# Patient Record
Sex: Male | Born: 2000 | Race: White | Hispanic: No | Marital: Single | Smoking: Never smoker
Health system: Southern US, Community
[De-identification: ages and names within clinical notes are randomized; demographics above are authoritative.]

## PROBLEM LIST (undated history)

## (undated) DIAGNOSIS — F419 Anxiety disorder, unspecified: Secondary | ICD-10-CM

## (undated) DIAGNOSIS — Z789 Other specified health status: Secondary | ICD-10-CM

## (undated) DIAGNOSIS — J302 Other seasonal allergic rhinitis: Secondary | ICD-10-CM

## (undated) HISTORY — PX: TYMPANOPLASTY: SHX33

---
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
## 2019-06-15 ENCOUNTER — Encounter: Payer: Self-pay | Admitting: Emergency Medicine

## 2019-06-15 ENCOUNTER — Ambulatory Visit
Admission: EM | Admit: 2019-06-15 | Discharge: 2019-06-15 | Disposition: A | Discharge status: Home / Self Care | Payer: Medicaid Other | Attending: Urgent Care | Admitting: Urgent Care

## 2019-06-15 ENCOUNTER — Other Ambulatory Visit: Payer: Self-pay

## 2019-06-15 DIAGNOSIS — Z20822 Contact with and (suspected) exposure to covid-19: Secondary | ICD-10-CM

## 2019-06-15 DIAGNOSIS — Z03818 Encounter for observation for suspected exposure to other biological agents ruled out: Secondary | ICD-10-CM | POA: Diagnosis not present

## 2019-06-15 DIAGNOSIS — J069 Acute upper respiratory infection, unspecified: Secondary | ICD-10-CM | POA: Insufficient documentation

## 2019-06-15 DIAGNOSIS — U071 COVID-19: Secondary | ICD-10-CM | POA: Insufficient documentation

## 2019-06-15 DIAGNOSIS — Z20828 Contact with and (suspected) exposure to other viral communicable diseases: Secondary | ICD-10-CM

## 2019-06-15 NOTE — Discharge Instructions (Signed)
Push fluids to ensure adequate hydration and keep secretions thin.  Tylenol and/or ibuprofen as needed for pain or fevers.  Rest.  Over the counter  treatments as needed for symptoms. Self isolate until covid results are back and negative.  Will notify you of any positive findings. You may monitor your results on your MyChart online as well.

## 2019-06-15 NOTE — ED Triage Notes (Signed)
Patient c/o cough, loss of taste and chills that started 2 days ago.

## 2019-06-15 NOTE — ED Provider Notes (Signed)
[facility]    CSN: [id number] Arrival date & time: 06/15/19  1303      History   Chief Complaint Chief Complaint  Patient presents with  . Cough    HPI Peter Brady is a 18 y.o. male.   Peter Brady presents with complaints of symptoms which started 2-3 days ago. While at work he developed a cough. Cough causes sore throat.  Has been waking up sweating. Feels warm. Chills. Decreased appetite. Yesterday with congestion. No chest pain. No fevers. Nausea. Diarrhea, approximately 4 times a day. No blood in diarrhea. No known ill contacts. 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 Loss of taste and smell. Ibuprofen the other day, no other medications for symptoms. Vapes. No asthma.    ROS per HPI, negative if not otherwise mentioned.      Past Medical History:  Diagnosis Date  . Patient denies medical problems     There are no active problems to display for this patient.   Past Surgical History:  Procedure Laterality Date  . TYMPANOPLASTY         Home Medications    Prior to Admission medications   Medication Sig Start Date End Date Taking? Authorizing Provider  oseltamivir (TAMIFLU) 75 MG capsule Take 1 capsule (75 mg total) by mouth every 12 (twelve) hours. 09/14/18   Marylene Land, NP    Family History History reviewed. No pertinent family history.  Social History Social History   Tobacco Use  . Smoking status: Never Smoker  . Smokeless tobacco: Never Used  Substance Use Topics  . Alcohol use: No  . Drug use: Never     Allergies   Patient has no known allergies.   Review of Systems Review of Systems   Physical Exam Triage Vital Signs ED Triage Vitals  Enc Vitals Group     BP 06/15/19 1314 126/85     Pulse Rate 06/15/19 1314 76     Resp 06/15/19 1314 14     Temp 06/15/19 1314 98.9 F (37.2 C)     Temp Source 06/15/19  1314 Oral     SpO2 06/15/19 1314 100 %     Weight 06/15/19 1310 141 lb (64 kg)     Height 06/15/19 1310 5\' 10"  (1.778 m)     Head Circumference --      Peak Flow --      Pain Score 06/15/19 1310 0     Pain Loc --      Pain Edu? --      Excl. in [place]? --    No data found.  Updated Vital Signs BP 126/85 (BP Location: Left Arm)   Pulse 76   Temp 98.9 F (37.2 C) (Oral)   Resp 14   Ht 5\' 10"  (1.778 m)   Wt 141 lb (64 kg)   SpO2 100%   BMI 20.23 kg/m    Physical Exam Constitutional:      Appearance: He is well-developed.  Cardiovascular:     Rate and Rhythm: Normal rate.  Pulmonary:     Effort: Pulmonary effort is normal.  Skin:    General: Skin is warm and dry.  Neurological:     Mental Status: He is alert and oriented to person, place, and time.      UC Treatments / Results  Labs (all labs ordered are listed, but only  abnormal results are displayed) Labs Reviewed  NOVEL CORONAVIRUS, NAA (HOSP ORDER, SEND-OUT TO REF LAB; TAT 18-24 HRS)    EKG   Radiology No results found.  Procedures Procedures (including critical care time)  Medications Ordered in UC Medications - No data to display  Initial Impression / Assessment and Plan / UC Course  I have reviewed the triage vital signs and the nursing notes.  Pertinent labs & imaging results that were available during my care of the patient were reviewed by me and considered in my medical decision making (see chart for details).     Non toxic. Benign physical exam.  Afebrile. covid testing collected and pending. History and physical consistent with viral illness.  Supportive cares recommended. Will notify of any positive findings and if any changes to treatment are needed.  Isolation encouraged until results back. Return precautions provided. Patient verbalized understanding and agreeable to plan.   Final Clinical Impressions(s) / UC Diagnoses   Final diagnoses:  Viral URI with cough  Encounter for laboratory  testing for COVID-19 virus     Discharge Instructions     Push fluids to ensure adequate hydration and keep secretions thin.  Tylenol and/or ibuprofen as needed for pain or fevers.  Rest.  Over the counter  treatments as needed for symptoms. Self isolate until covid results are back and negative.  Will notify you of any positive findings. You may monitor your results on your MyChart online as well.      ED Prescriptions    None     PDMP not reviewed this encounter.   Georgetta Haber, NP 06/15/19 1343

## 2019-06-16 ENCOUNTER — Telehealth (HOSPITAL_COMMUNITY): Payer: Self-pay | Admitting: Emergency Medicine

## 2019-06-16 MED ORDER — BENZONATATE 100 MG PO CAPS: 100.0000 mg | ORAL_CAPSULE | Freq: Three times a day (TID) | ORAL | 0 refills | Status: DC

## 2019-06-16 NOTE — Telephone Encounter (Signed)
Positive covid, Patient contacted and made aware of    results, all questions answered Gave info for quarantine, pt c/o dry cough, per kelly okay to send tessalon.

## 2019-06-19 LAB — NOVEL CORONAVIRUS, NAA (HOSP ORDER, SEND-OUT TO REF LAB; TAT 18-24 HRS): SARS-CoV-2, NAA: DETECTED — AB

## 2020-02-18 ENCOUNTER — Ambulatory Visit (INDEPENDENT_AMBULATORY_CARE_PROVIDER_SITE_OTHER): Payer: Medicaid Other | Admitting: Family Medicine

## 2020-02-18 ENCOUNTER — Other Ambulatory Visit: Payer: Self-pay

## 2020-02-18 ENCOUNTER — Encounter: Payer: Self-pay | Admitting: Family Medicine

## 2020-02-18 VITALS — BP 102/50 | HR 66 | Temp 98.2°F | Ht 69.0 in | Wt 142.8 lb

## 2020-02-18 DIAGNOSIS — F419 Anxiety disorder, unspecified: Secondary | ICD-10-CM | POA: Diagnosis not present

## 2020-02-18 DIAGNOSIS — Z915 Personal history of self-harm: Secondary | ICD-10-CM

## 2020-02-18 DIAGNOSIS — J302 Other seasonal allergic rhinitis: Secondary | ICD-10-CM | POA: Diagnosis not present

## 2020-02-18 DIAGNOSIS — F1210 Cannabis abuse, uncomplicated: Secondary | ICD-10-CM

## 2020-02-18 DIAGNOSIS — F329 Major depressive disorder, single episode, unspecified: Secondary | ICD-10-CM | POA: Diagnosis not present

## 2020-02-18 DIAGNOSIS — Z7689 Persons encountering health services in other specified circumstances: Secondary | ICD-10-CM

## 2020-02-18 DIAGNOSIS — Z818 Family history of other mental and behavioral disorders: Secondary | ICD-10-CM | POA: Insufficient documentation

## 2020-02-18 DIAGNOSIS — Z9151 Personal history of suicidal behavior: Secondary | ICD-10-CM | POA: Insufficient documentation

## 2020-02-18 LAB — POCT URINALYSIS DIPSTICK
Bilirubin, UA: NEGATIVE
Blood, UA: NEGATIVE
Glucose, UA: NEGATIVE
Ketones, UA: NEGATIVE
Leukocytes, UA: NEGATIVE
Nitrite, UA: NEGATIVE
Protein, UA: NEGATIVE
Spec Grav, UA: 1.025
Urobilinogen, UA: 0.2 U/dL
pH, UA: 5

## 2020-02-18 MED ORDER — BUSPIRONE HCL 7.5 MG PO TABS: 7.5000 mg | ORAL_TABLET | Freq: Three times a day (TID) | ORAL | 1 refills | Status: DC

## 2020-02-18 MED ORDER — CETIRIZINE HCL 10 MG PO TABS: 10.0000 mg | ORAL_TABLET | Freq: Every day | ORAL | 3 refills | Status: DC

## 2020-02-18 NOTE — Assessment & Plan Note (Signed)
History 2018 with [facility], hx of attempted overdose with  melatonin

## 2020-02-18 NOTE — Assessment & Plan Note (Signed)
Seasonal allergies, previously treated with claritin.  Reports suboptimbal control.  Requesting to change medications.  Plan: 1. Stop claritin, BEGIN Cetrizine.  Rx sent to pharmacy on file 2. Follow up 4 weeks

## 2020-02-18 NOTE — Assessment & Plan Note (Signed)
See anxiety A/P. 

## 2020-02-18 NOTE — Assessment & Plan Note (Addendum)
Current severe anxiety and depression, interfering with work and sleep. Reportedly was on fluoxetine 20mg  in 2018, had issues with having an erection and abruptly stopped the medications.  Has not tried any additional SSRIs since.  Tearful during exam.  Has history of suicide attempt in 2018 and was inpatient hospitalized for attempt to overdose on melatonin.  Reports recently had found out his girlfriend was cheating on him and same day found out his father was hospitalized for a suicide attempt.  Plan: 1. Begin Buspar 7.5mg  TID PRN for anxiety 2. STAT referral to psychiatry made today for medication management and counseling 3. Follow up in 3 months

## 2020-02-18 NOTE — Progress Notes (Signed)
Subjective:    Patient ID: Peter Brady, male    DOB: May 07, 2001, 19 y.o.   MRN: [id number]  Peter Brady is a 19 y.o. male presenting on 02/18/2020 for Establish Care (anxiety, depression. Pt state his anxiety is starting toi nterfere with his job.)   HPI  Previous PCP was at [facility] on [address].  Records will be requested.  Past medical, family, and surgical history reviewed w/ pt.   Mr. Stetzer presents to clinic with acute concerns of anxiety and depression.  Reports that he has been having increased anxiety and tearfulness throughout the day.  He has been having to take extra breaks at work so he is not crying in front of co-workers.  Reports that he has been anxious if he is doing a good job at work, even with praise from his [employer]chand[employer], [employer].  Finds that he is waking up in the middle of the night with anxiety and racing thoughts.  Past suicide attempt in 2018, had attempted to overdose on melatonin, was brought to [facility] and was admitted.  Has not followed with psychiatry or counseling since.  Denies current SI/HI.  Depression screen PHQ 2/9 02/18/2020  Decreased Interest 3  Down, Depressed, Hopeless 3  PHQ - 2 Score 6  Altered sleeping 3  Tired, decreased energy 0  Change in appetite 3  Trouble concentrating 0  Moving slowly or fidgety/restless 0  Suicidal thoughts 0  PHQ-9 Score 12  Difficult doing work/chores Extremely dIfficult    Social History   Tobacco Use  . Smoking status: Never Smoker  . Smokeless tobacco: Never Used  Vaping Use  . Vaping Use: Every day  . Substances: Nicotine, Flavoring  Substance Use Topics  . Alcohol use: No  . Drug use: Never    Review of Systems  Constitutional: Negative.   HENT: Positive for rhinorrhea. Negative for congestion, dental problem, drooling, ear discharge, ear pain, facial swelling, hearing loss, mouth sores, nosebleeds, postnasal drip, sinus pressure, sinus pain, sneezing, sore throat, tinnitus, trouble swallowing  and voice change.   Eyes: Negative.   Respiratory: Negative.   Cardiovascular: Negative.   Gastrointestinal: Negative.   Endocrine: Negative.   Genitourinary: Negative.   Musculoskeletal: Negative.   Skin: Negative.   Allergic/Immunologic: Negative.   Neurological: Negative.   Hematological: Negative.   Psychiatric/Behavioral: Positive for dysphoric mood and sleep disturbance. Negative for agitation, behavioral problems, confusion, decreased concentration, hallucinations, self-injury and suicidal ideas. The patient is nervous/anxious. The patient is not hyperactive.    Per HPI unless specifically indicated above     Objective:    BP (!) 102/50 (BP Location: Left Arm, Patient Position: Sitting, Cuff Size: Normal)   Pulse 66   Temp 98.2 F (36.8 C) (Temporal)   Ht 5\' 9"  (1.753 m)   Wt 142 lb 12.8 oz (64.8 kg)   BMI 21.09 kg/m   Wt Readings from Last 3 Encounters:  02/18/20 142 lb 12.8 oz (64.8 kg) (29 %, Z= -0.54)*  06/15/19 141 lb (64 kg) (30 %, Z= -0.53)*  09/14/18 170 lb (77.1 kg) (77 %, Z= 0.73)*   * Growth percentiles are based on CDC (Boys, 2-20 Years) data.    Physical Exam Vitals reviewed.  Constitutional:      General: He is not in acute distress.    Appearance: Normal appearance. He is well-developed, well-groomed and normal weight. He is not ill-appearing or toxic-appearing.  HENT:     Head: Normocephalic and atraumatic.  Nose:     Comments: Lesia Sago is in place, covering mouth and nose  Eyes:     General:        Right eye: No discharge.        Left eye: No discharge.     Extraocular Movements: Extraocular movements intact.     Conjunctiva/sclera: Conjunctivae normal.     Pupils: Pupils are equal, round, and reactive to light.  Cardiovascular:     Rate and Rhythm: Normal rate and regular rhythm.     Pulses: Normal pulses.     Heart sounds: Normal heart sounds. No murmur heard.  No friction rub. No gallop.   Pulmonary:     Effort: Pulmonary effort  is normal. No respiratory distress.     Breath sounds: Normal breath sounds.  Skin:    General: Skin is warm and dry.     Capillary Refill: Capillary refill takes less than 2 seconds.  Neurological:     General: No focal deficit present.     Mental Status: He is alert and oriented to person, place, and time.     Cranial Nerves: No cranial nerve deficit.     Sensory: No sensory deficit.     Motor: No weakness.     Coordination: Coordination normal.     Gait: Gait normal.  Psychiatric:        Attention and Perception: Attention and perception normal.        Mood and Affect: Mood is anxious and depressed. Affect is tearful.        Speech: Speech normal.        Behavior: Behavior normal. Behavior is cooperative.        Thought Content: Thought content normal.        Cognition and Memory: Cognition and memory normal.        Judgment: Judgment normal.    Results for orders placed or performed in visit on 02/18/20  POCT Urinalysis Dipstick  Result Value Ref Range   Color, UA Yellow    Clarity, UA clear    Glucose, UA Negative Negative   Bilirubin, UA negative    Ketones, UA negative    Spec Grav, UA 1.025 1.010 - 1.025   Blood, UA negative    pH, UA 5.0 5.0 - 8.0   Protein, UA Negative Negative   Urobilinogen, UA 0.2 0.2 or 1.0 E.U./dL   Nitrite, UA negative    Leukocytes, UA Negative Negative   Appearance     Odor        Assessment & Plan:   Problem List Items Addressed This Visit      Respiratory   Allergic rhinitis    Seasonal allergies, previously treated with claritin.  Reports suboptimbal control.  Requesting to change medications.  Plan: 1. Stop claritin, BEGIN Cetrizine.  Rx sent to pharmacy on file 2. Follow up 4 weeks      Relevant Medications   cetirizine (ZYRTEC) 10 MG tablet     Other   Anxiety    Current severe anxiety and depression, interfering with work and sleep. Reportedly was on fluoxetine 20mg  in 2018, had issues with having an erection and  abruptly stopped the medications.  Has not tried any additional SSRIs since.  Tearful during exam.  Has history of suicide attempt in 2018 and was inpatient hospitalized for attempt to overdose on melatonin.  Reports recently had found out his girlfriend was cheating on him and same day found out his father was hospitalized for  a suicide attempt.  Plan: 1. Begin Buspar 7.5mg  TID PRN for anxiety 2. STAT referral to psychiatry made today for medication management and counseling 3. Follow up in 3 months      Relevant Medications   busPIRone (BUSPAR) 7.5 MG tablet   Cannabis use disorder, mild, abuse    See anxiety A/P      Major depressive episode    See anxiety A/P      Relevant Medications   busPIRone (BUSPAR) 7.5 MG tablet   History of suicide attempt    History 2018 with [facility], hx of attempted overdose with  melatonin      Family history of suicide attempt    Other Visit Diagnoses    Encounter to establish care    -  Primary   Relevant Medications   busPIRone (BUSPAR) 7.5 MG tablet   Other Relevant Orders   POCT Urinalysis Dipstick (Completed)      Meds ordered this encounter  Medications  . busPIRone (BUSPAR) 7.5 MG tablet    Sig: Take 1 tablet (7.5 mg total) by mouth 3 (three) times daily.    Dispense:  90 tablet    Refill:  1  . cetirizine (ZYRTEC) 10 MG tablet    Sig: Take 1 tablet (10 mg total) by mouth daily.    Dispense:  90 tablet    Refill:  3      Follow up plan: Return in about 4 weeks (around 03/17/2020) for Anxiety follow up.   Harlin Rain, [facility] Nurse Practitioner [facility] 02/18/2020, 11:19 AM

## 2020-02-18 NOTE — Patient Instructions (Signed)
I have sent in a prescription for Buspar 7.38m to take up to 3x per day as needed for anxiety.  A referral to psychiatry has been placed today.  If you have not heard from the specialty office or our referral coordinator within 1 week, please let u[place]now and we will follow up with the referral coordinator for an update.  The following recommendations are helpful adjuncts for helping rebalance your mood.  Eat a nourishing diet. Ensure adequate intake of calories, protein, carbs, fat, vitamins, and minerals. Prioritize whole foods at each meal, including meats, vegetables, fruits, nuts and seeds, etc.   Avoid inflammatory and/or "junk" foods, such as sugar, omega-6 fats, refined grains, chemicals, and preservatives are common in packaged and prepared foods. Minimize or completely avoid these ingredients and stick to whole foods with little to no additives. Cook from scratch as much as possible for more control over what you eat  Get enough sleep. Poor sleep is significantly associated with depression and anxiety. Make 7-9 hours of sleep nightly a top priority  Exercise appropriately. Exercise is known to improve brain functioning and boost mood. Aim for 30 minutes of daily physical activity. Avoid "overtraining," which can cause mental disturbances  Assess your light exposure. Not enough natural light during the day and too much artificial light can have a major impact on your mood. Get outside as often as possible during daylight hours. Minimize light exposure after dark and avoid the use of electronics that give off blue light before bed  Manage your stress.  Use daily stress management techniques such as meditation, yoga, or mindfulness to retrain your brain to respond differently to stress. Try deep breathing to deactivate your "fight or flight" response.  There are many of sources with apps like Headspace, Calm or a variety of YouTube videos (videos from JGwynne Edingerhave guided  meditation)  Prioritize your social life. Work on building social support with new friends or improve current relationships. Consider getting a pet that allows for companionship, social interaction, and physical touch. Try volunteering or joining a faith-based community to increase your sense of purpose  4-7-8 breathing technique at bedtime: breathe in to count of 4, hold breath for count of 7, exhale for count of 8; do 3-5 times for letting go of overactive thoughts  Take time to play Unstructured "play" time can help reduce anxiety and depression Options for play include music, games, sports, dance, art, etc.  Try to add daily omega 3 fatty acids, magnesium, B complex, and balanced amino acid supplements to help improve mood and anxiety.  Sleep hygiene is the single most effective treatment for sleep issues, but it is hard work.  Tips for a good night's sleep:  -Keep sleep environment comfortable and conducive to sleep -Keep regular sleep schedule 7 nights a week -Avoiding naps during the day -Avoiding going to bed until drowsy and ready to sleep, not trying to sleep, and not watching the clock -Get out of bed if not asleep within 15-20 minutes and returning only when drowsy -Avoiding caffeine, nicotine, alcohol, and other substances that interfere with sleep before bedtime -Take an hour before your set bedtime and start to wind down: bath/shower, no more TV or phone (the blue light can interfere with sleeping), listen to soothing music, or meditation -No TV in your bedroom -Exercising regularly, at least 6 hours before sleep. Yoga and Tai Chi can improve sleep quality  There are a lot of books and apps that may help guide  you with any of the following:   -Progressive muscle relaxation (involves methodical tension and relaxation of different Muscle groups throughout body)  Guided imagery  -YouTube - Gwynne Edinger has free videos on YouTube that can help with meditation and some    Abdominal breathing   Over the counter sleep aid one hour before bed- and gradually wean your use over 2-4 weeks  Some examples are : *Melatonin 5-10 mg *Sleepology (Can find on [employer]) taken according to packaging directions  There are a few online evidence based online programs, unfortunately they are not free.   Developed by a sleep expert who created a drug-free program for insomnia proven more effective than sleeping pills.  www.cbtforinsomnia.com Sleepio is an evidence-based digital sleep improvement program   www.sleepio.com SHUTi is designed to actively help retrain your body and mind for great sleep through six engaging Cognitive Behavioral Therapy for Insomnia strategy and learning sessions  [email or web address]  We will plan to see you back in 4 weeks for anxiety follow up  You will receive a survey after today's visit either digitally by e-mail or paper by [employer]. Your experiences and feedback matter to [place].  Please respond so we know how we are doing as we provide care for you.  Call us with any questions/concerns/needs.  It is my goal to be available to you for your health concerns.  Thanks for choosing me to be a partner in your healthcare needs!  Harlin Rain, FNP-C Family Nurse Practitioner [facility] Phone: [phone]

## 2020-02-19 ENCOUNTER — Telehealth: Payer: Self-pay

## 2020-02-19 NOTE — Telephone Encounter (Signed)
-----   Message from Tarri Fuller, FNP sent at 02/19/2020  3:53 PM EDT ----- Regarding: Referral Placed Hey team,      I put in an urgent behavioral health referral for this patient.  He needs a higher level of care than what [facility] can accommodate.  I was given these resources of [facility] or [facility] (both walk in and he would need to arrive by 8am) or [facility] [phone] and he can call and schedule an appointment.  I tried to contact him but the phone went to voicemail and the mailbox is not set up.  I will continue to try him but wanted you to have the information I was trying to provide in case he called back.

## 2020-02-20 NOTE — Telephone Encounter (Signed)
Telephone call to patient.  Provided [facility] and [facility] contact phone numbers to call and schedule for medication management.  Patient verbalized understanding and denied any acute concerns for FNP at this time.

## 2020-02-26 ENCOUNTER — Ambulatory Visit: Payer: Medicaid Other | Admitting: Licensed Clinical Social Worker

## 2020-02-27 NOTE — Chronic Care Management (AMB) (Signed)
Visit Information  Goals Addressed    .  SW- "I want to work on my mental health" (pt-stated)        [place] (see longitudinal plan of care for additional care plan information)  Current Barriers:  . Financial constraints related to affording care . Limited social support . Transportation . Mental Health Concerns  . Social Isolation . Lacks knowledge of community resource: available mental health providers that accept Medicaid.   Clinical Social Work Clinical Goal(s):   Marland Kitchen Over the next 120 days, patient will work with SW to address concerns related to gaining additional mental health support/resource connection in order to maintain overall health . Over the next 120 days, patient will demonstrate improved adherence to self care as evidenced by implementing healthy self-care into his daily routine such as: attending all medical appointments, deep breathing exercsies, taking time for self-reflection, taking medications as prescribed, drinking water and daily exercise to improve mobility.  . Over the next 120 days, patient will demonstrate improved health management independence as evidenced by implementing healthy self-care and positive support and mental health resources into his daily routine to cope with stressors and improve overall health and well-being   Interventions: . Inter-disciplinary care team collaboration (see longitudinal plan of care) . Patient interviewed and appropriate assessments performed . Provided mental health counseling with regard to coping with major depressive disorder . Provided patient with information about available mental health resources and support groups. LCSW sent text message to patient with the following resources: . [facility]) & [facility] . Mental Health Clinic . Address: [address] [place], [place] [address] . Phone:  [phone] . [facility] . [address] [place], [place]  [address] . Main: [phone] . [facility]: [facility] . Mental health clinic in [place], [place] . Located in: [facility] . Address: [address] [place], [place], [place] [address] . Phone: [phone] . [facility] Address: [address], [place], [place] [address] . Phone: [phone] . [facility] . Medical clinic in [place], [place] . Address: [place], [place], [place] [address] . Phone: [phone] . [facility] . Mental health service in [place], [place] . Address: [place], [place], [place] [address] . Phone: [phone] . [facility] . Psychiatrist in [place], [place] . Address: [address] [place], [place] [address] . Appointments: [email or web address] . Phone: [phone] . [facility] . [address] [place], [place] [address] . [phone] . [facility] . [place], [place] [address] . [phone] . [employer] . [place] [address] [place], [place] [address] . [phone] . [facility]  . Address: [address] [place], [place] [address] . Phone: [phone] . [facility] . [place], [place] [address] . [phone] . [facility] counseling . Family counselor in [place], [place] . Address: [address] [place],  [address] . Phone: [phone] . [facility] [place] Address: [address], [place],  [address] . Phone: [phone] . [facility] . [phone] . General Email: [facility]@therapist .net . HIPAA Compliant Secure  Email . Info@[facility] .com . [place],  [place] [address] . Civil engineer, contracting  [employer] . Hospice in [place], [facility] . Address: [address] [place], [place], [place] [address] . Phone: [phone] . FREE Individual Counseling and Support Groups  . For group support I would call [facility]- [phone]. They have groups for - Anxiety disorders, mood disorders (depression, bipolar), schizophrenia, dual diagnoses (mental illness/substance abuse), eating disorders (anorexia, bulimia) at the [facility].  [facility] ACT Team [phone]- Program has Behavioral Health Outpatient follow up. . [facility] Community Treatment Team in [place] . Our Assertive Community Treatment Team is an evidence-based service designed for adults with serious mental illness that provides a full range of treatment, rehabilitation and support services by an interdisciplinary team in a variety of community based settings. Services include psychiatric and nursing services, assistance with housing and employment, substance abuse treatment, peer support and case management. Patient must have Medicaid or State funding for this service.  . Discussed plans with patient for ongoing care management follow up and provided patient with direct contact information for care management team . Advised patient to contact [facility], [facility] and [facility] for enrollment. . Assisted patient/caregiver with obtaining information about health plan benefits . LCSW discussed coping skills for anxiety and depression. SW used empathetic and active and reflective listening, validated patient's feelings/concerns, and provided emotional support. LCSW provided self-care education to help manage his conditions and improve his overall mood.  Marland Kitchen Referred patient to [facility] for long term follow up and  therapy/counseling  Patient Self Care Activities:  . Calls provider office for new concerns or questions . Lacks social connections  Initial goal documentation     Peter Brady was given information about Care Management services today including:  1. Care Management services include personalized support from designated clinical staff supervised by his physician, including individualized plan of care and coordination with other care providers 2. 24/7 contact phone numbers for assistance for urgent and routine care needs. 3. The patient may stop CCM services at any time (effective at the end of the month) by phone call to the office staff.  Patient agreed to services and verbal consent obtained.   The care management team will reach out to the patient again over the next quarter.  Dickie La, BSW, MSW, LCSW [facility] [place]  [facility] [email or web address] Phone: [phone]

## 2020-03-14 ENCOUNTER — Ambulatory Visit: Payer: Medicaid Other | Admitting: Family Medicine

## 2020-03-17 ENCOUNTER — Encounter: Payer: Self-pay | Admitting: Emergency Medicine

## 2020-03-17 ENCOUNTER — Other Ambulatory Visit: Payer: Self-pay

## 2020-03-17 ENCOUNTER — Ambulatory Visit
Admission: EM | Admit: 2020-03-17 | Discharge: 2020-03-17 | Disposition: A | Discharge status: Other Acute Inpatient Hospital | Payer: Medicaid Other | Attending: Family Medicine | Admitting: Family Medicine

## 2020-03-17 DIAGNOSIS — N50812 Left testicular pain: Secondary | ICD-10-CM

## 2020-03-17 DIAGNOSIS — S3994XA Unspecified injury of external genitals, initial encounter: Secondary | ICD-10-CM | POA: Diagnosis not present

## 2020-03-17 HISTORY — DX: Other seasonal allergic rhinitis: J30.2

## 2020-03-17 HISTORY — DX: Anxiety disorder, unspecified: F41.9
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
## 2020-03-17 NOTE — Discharge Instructions (Addendum)
Recommend to go to Emergency Department for further evaluation/management
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
## 2020-03-18 ENCOUNTER — Other Ambulatory Visit: Payer: Self-pay

## 2020-03-18 ENCOUNTER — Ambulatory Visit (INDEPENDENT_AMBULATORY_CARE_PROVIDER_SITE_OTHER): Payer: Medicaid Other | Admitting: Family Medicine

## 2020-03-18 ENCOUNTER — Ambulatory Visit
Admission: RE | Admit: 2020-03-18 | Discharge: 2020-03-18 | Disposition: A | Discharge status: Home / Self Care | Payer: Medicaid Other | Source: Ambulatory Visit | Attending: Family Medicine | Admitting: Family Medicine

## 2020-03-18 ENCOUNTER — Encounter: Payer: Self-pay | Admitting: Family Medicine

## 2020-03-18 VITALS — BP 115/63 | HR 70 | Temp 97.7°F | Ht 69.0 in | Wt 135.0 lb

## 2020-03-18 DIAGNOSIS — F419 Anxiety disorder, unspecified: Secondary | ICD-10-CM

## 2020-03-18 DIAGNOSIS — N50812 Left testicular pain: Secondary | ICD-10-CM | POA: Diagnosis present

## 2020-03-18 DIAGNOSIS — F5104 Psychophysiologic insomnia: Secondary | ICD-10-CM | POA: Diagnosis not present

## 2020-03-18 MED ORDER — HYDROXYZINE PAMOATE 25 MG PO CAPS: 25.0000 mg | ORAL_CAPSULE | Freq: Every evening | ORAL | 0 refills | Status: DC | PRN

## 2020-03-18 MED ORDER — BUSPIRONE HCL 15 MG PO TABS: 15.0000 mg | ORAL_TABLET | Freq: Two times a day (BID) | ORAL | 1 refills | Status: DC

## 2020-03-18 NOTE — Patient Instructions (Signed)
I have switched your buspar from 7.5mg  3x per day to 15mg  twice per day.  This will make for easier dosing, especially when you are working.  I have sent in a prescription for vistaril to take 1 tablet at bedtime to help with anxiety at night.  We have put in an order for a STAT scrotum ultrasound.  Please complete this ultrasound and wait in the office for to be notified so we can let you know the results.  We will plan to see you back in 4 weeks for anxiety follow up visit  You will receive a survey after today's visit either digitally by e-mail or paper by [employer] mail. Your experiences and feedback matter to [place].  Please respond so we know how we are doing as we provide care for you.  Call us with any questions/concerns/needs.  It is my goal to be available to you for your health concerns.  Thanks for choosing me to be a partner in your healthcare needs!  [place], FNP-C Family Nurse Practitioner [facility] Phone: [phone]
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
## 2020-03-18 NOTE — Progress Notes (Signed)
Subjective:    Patient ID: Peter Brady, male    DOB: 09/10/2000, 19 y.o.   MRN: [id number]  Peter Brady is a 19 y.o. male presenting on 03/18/2020 for Anxiety and Testicle Pain ( c/o left testicle pain  x 2 days . States he got hit in the testicles with a box . He was seen at the Urgent Care on Monday and was told he possibly need an Ultrasound )   HPI  Peter Brady presents to clinic for follow up on his anxiety since starting on buspar.  Reports that he has been having good symptom improvement but would prefer to change dosing to twice per day instead of three times per day.  Is having some difficulty sleeping and is unsure if this is his anxiety or the buspar that is doing this.  Looking for something to help with sleep difficulties.  Patient also has concerns for left testicle pain x 2 days.  Reports he had been helping a friend with boxes, had a box hit him in the left testicle and did not have pain immediately, but as the time progressed he has had worsening pain in the groin and left testicle, at points, bringing him to tears.  He went to work yesterday and had to leave after a short while due to his pain.  Reports when he sits, he has symptom relief, but when he is on his feet for more than 15 minutes or is moving around his pain increases.  Reports went to urgent care yesterday and was advised to proceed to the emergency room for evaluation.   Depression screen [facility] 2/9 03/18/2020 02/18/2020  Decreased Interest 1 3  Down, Depressed, Hopeless 1 3  PHQ - 2 Score 2 6  Altered sleeping 3 3  Tired, decreased energy 0 0  Change in appetite 3 3  Feeling bad or failure about yourself  1 -  Trouble concentrating 1 0  Moving slowly or fidgety/restless 0 0  Suicidal thoughts 1 0  PHQ-9 Score 11 12  Difficult doing work/chores Very difficult Extremely dIfficult    Social History   Tobacco Use  . Smoking status: Never Smoker  . Smokeless tobacco: Never Used  Vaping Use  . Vaping Use: Every  day  . Substances: Nicotine, THC, CBD, Flavoring  Substance Use Topics  . Alcohol use: Yes    Comment: social  . Drug use: Never    Review of Systems  Constitutional: Negative.   HENT: Negative.   Eyes: Negative.   Respiratory: Negative.   Cardiovascular: Negative.   Gastrointestinal: Negative.   Endocrine: Negative.   Genitourinary: Positive for scrotal swelling and testicular pain. Negative for decreased urine volume, difficulty urinating, discharge, dysuria, enuresis, flank pain, frequency, genital sores, hematuria, penile pain, penile swelling and urgency.  Musculoskeletal: Negative.   Skin: Negative.   Allergic/Immunologic: Negative.   Neurological: Negative.   Hematological: Negative.   Psychiatric/Behavioral: Positive for sleep disturbance. Negative for agitation, behavioral problems, confusion, decreased concentration, dysphoric mood, hallucinations, self-injury and suicidal ideas. The patient is nervous/anxious. The patient is not hyperactive.    Per HPI unless specifically indicated above     Objective:    BP 115/63 (BP Location: Left Arm, Patient Position: Sitting, Cuff Size: Normal)   Pulse 70   Temp 97.7 F (36.5 C) (Temporal)   Ht 5\' 9"  (1.753 m)   Wt 135 lb (61.2 kg)   BMI 19.94 kg/m   Wt Readings from Last 3 Encounters:  03/18/20 135 lb (61.2 kg) (17 %, Z= -0.94)*  03/17/20 140 lb (63.5 kg) (25 %, Z= -0.68)*  02/18/20 142 lb 12.8 oz (64.8 kg) (29 %, Z= -0.54)*   * Growth percentiles are based on CDC (Boys, 2-20 Years) data.    Physical Exam Vitals reviewed. Exam conducted with a chaperone present Tedd Sias, CMA).  Constitutional:      General: He is not in acute distress.    Appearance: Normal appearance. He is well-developed, well-groomed and normal weight. He is not ill-appearing or toxic-appearing.  HENT:     Head: Normocephalic and atraumatic.     Nose:     Comments: Lesia Sago is in place, covering mouth and nose. Eyes:     General:        Right  eye: No discharge.        Left eye: No discharge.     Extraocular Movements: Extraocular movements intact.     Conjunctiva/sclera: Conjunctivae normal.     Pupils: Pupils are equal, round, and reactive to light.  Cardiovascular:     Rate and Rhythm: Normal rate and regular rhythm.     Pulses: Normal pulses.     Heart sounds: Normal heart sounds. No murmur heard.  No friction rub. No gallop.   Pulmonary:     Effort: Pulmonary effort is normal. No respiratory distress.     Breath sounds: Normal breath sounds.  Abdominal:     Hernia: There is no hernia in the left inguinal area or right inguinal area.  Genitourinary:    Pubic Area: No rash or pubic lice.      Penis: Normal and circumcised. No discharge, swelling or lesions.      Testes:        Right: Mass, tenderness, swelling, testicular hydrocele or varicocele not present. Right testis is descended. Cremasteric reflex is present.         Left: Tenderness and swelling present. Mass, testicular hydrocele or varicocele not present. Left testis is descended. Cremasteric reflex is present.   Musculoskeletal:     Right lower leg: No edema.     Left lower leg: No edema.  Lymphadenopathy:     Lower Body: No right inguinal adenopathy. Left inguinal adenopathy present.     Comments: Shotty, mobile, non-tender lymph node x1 in left inguinal  Skin:    General: Skin is warm and dry.     Capillary Refill: Capillary refill takes less than 2 seconds.  Neurological:     General: No focal deficit present.     Mental Status: He is alert and oriented to person, place, and time.  Psychiatric:        Attention and Perception: Attention and perception normal.        Mood and Affect: Affect normal. Mood is anxious.        Speech: Speech normal.        Behavior: Behavior normal. Behavior is cooperative.        Thought Content: Thought content normal.        Cognition and Memory: Cognition and memory normal.    Results for orders placed or performed  in visit on 02/18/20  POCT Urinalysis Dipstick  Result Value Ref Range   Color, UA Yellow    Clarity, UA clear    Glucose, UA Negative Negative   Bilirubin, UA negative    Ketones, UA negative    Spec Grav, UA 1.025 1.010 - 1.025   Blood, UA negative    pH, UA  5.0 5.0 - 8.0   Protein, UA Negative Negative   Urobilinogen, UA 0.2 0.2 or 1.0 E.U./dL   Nitrite, UA negative    Leukocytes, UA Negative Negative   Appearance     Odor        Assessment & Plan:   Problem List Items Addressed This Visit      Other   Anxiety    Moderate anxiety and depression, reports is lessened with buspar 7.5mg  TID, requesting to switch to BID for better dosing for him.  Has referral to psychiatry but has not had time to schedule an appointment with his busy work schedule.  Has anxiety at night, some difficulty with sleep.  Plan: 1. Change buspar 7.5mg  TID PRN to buspar 15mg  BID PRN 2. Can take vistaril 25mg , 1 tablet at bedtime for insomnia 3. Contact local psychiatry resources and schedule new patient appointment 4. Follow up in 4 weeks      Relevant Medications   busPIRone (BUSPAR) 15 MG tablet   hydrOXYzine (VISTARIL) 25 MG capsule   Pain in left testicle - Primary    Pain in left testicle x 2 days s/p trauma to left side of scrotum with a moving box.  Increased pain with standing/moving around, some relief of symptoms when sitting down.  Pain has brought him to tears yesterday and required him to leave work and proceed to Urgent Care.  Was advised at Urgent Care yesterday to proceed to the emergency room for evaluation.  Plan: 1. STAT of Scrotum to be completed today at [facility] 2. Will contact patient once ultrasound has resulted      Relevant Orders   US SCROTUM W/DOPPLER    Other Visit Diagnoses    Psychophysiological insomnia       Relevant Medications   hydrOXYzine (VISTARIL) 25 MG capsule      Meds ordered this encounter  Medications  .  busPIRone (BUSPAR) 15 MG tablet    Sig: Take 1 tablet (15 mg total) by mouth 2 (two) times daily.    Dispense:  60 tablet    Refill:  1  . hydrOXYzine (VISTARIL) 25 MG capsule    Sig: Take 1 capsule (25 mg total) by mouth at bedtime as needed.    Dispense:  30 capsule    Refill:  0      Follow up plan: Return in about 4 weeks (around 04/15/2020) for Anxiety follow up visit.   [place], FNP Family Nurse Practitioner [facility] 03/18/2020, 4:20 PM

## 2020-03-18 NOTE — Assessment & Plan Note (Addendum)
Moderate anxiety and depression, reports is lessened with buspar 7.5mg  TID, requesting to switch to BID for better dosing for him.  Has referral to psychiatry but has not had time to schedule an appointment with his busy work schedule.  Has anxiety at night, some difficulty with sleep.  Plan: 1. Change buspar 7.5mg  TID PRN to buspar 15mg  BID PRN 2. Can take vistaril 25mg , 1 tablet at bedtime for insomnia 3. Contact local psychiatry resources and schedule new patient appointment 4. Follow up in 4 weeks

## 2020-03-19 ENCOUNTER — Telehealth: Payer: Self-pay | Admitting: *Deleted

## 2020-03-19 NOTE — Telephone Encounter (Signed)
Reviewed testicular ultasound results and provider's note with the patient. No further questions.

## 2020-03-20 ENCOUNTER — Ambulatory Visit: Payer: Medicaid Other | Admitting: Family Medicine

## 2020-03-30 ENCOUNTER — Encounter: Payer: Self-pay | Admitting: Emergency Medicine

## 2020-03-30 ENCOUNTER — Ambulatory Visit
Admission: EM | Admit: 2020-03-30 | Discharge: 2020-03-30 | Disposition: A | Discharge status: Home / Self Care | Payer: Medicaid Other

## 2020-03-30 ENCOUNTER — Other Ambulatory Visit: Payer: Self-pay

## 2020-03-30 DIAGNOSIS — H6981 Other specified disorders of Eustachian tube, right ear: Secondary | ICD-10-CM

## 2020-03-30 NOTE — ED Provider Notes (Signed)
[facility]    CSN: [id number] Arrival date & time: 03/30/20  1159      History   Chief Complaint Chief Complaint  Patient presents with  . Otalgia    HPI Peter Brady is a 19 y.o. male. who presents with R ear pain which started yesterday and at night time when he laid down heard a high pitch ringing which has resolved. His allergies have been acting up and his Zyetec does not seem to be helping. Has used Flonase in the past, but has not been using it lately and recalls this had helped. Denies fever. The ringing is gone and does not have pain any more, but he just wants it checked since he used to be prone to getting ear infections.     Past Medical History:  Diagnosis Date  . Anxiety   . Patient denies medical problems   . Seasonal allergies     Patient Active Problem List   Diagnosis Date Noted  . Pain in left testicle 03/18/2020  . History of suicide attempt 02/18/2020  . Family history of suicide attempt 02/18/2020  . Vitamin D deficiency 12/20/2016  . Allergic rhinitis 12/20/2016  . Anxiety 12/13/2016  . Cannabis use disorder, mild, abuse 12/13/2016  . Major depressive episode 12/13/2016    Past Surgical History:  Procedure Laterality Date  . TYMPANOPLASTY         Home Medications    Prior to Admission medications   Medication Sig Start Date End Date Taking? Authorizing Provider  busPIRone (BUSPAR) 15 MG tablet Take 1 tablet (15 mg total) by mouth 2 (two) times daily. 03/18/20  Yes Malfi, Jodelle Gross, FNP  cetirizine (ZYRTEC) 10 MG tablet Take 1 tablet (10 mg total) by mouth daily. 02/18/20  Yes Malfi, Jodelle Gross, FNP  hydrOXYzine (VISTARIL) 25 MG capsule Take 1 capsule (25 mg total) by mouth at bedtime as needed. 03/18/20  Yes Malfi, Jodelle Gross, FNP    Family History Family History  Problem Relation Age of Onset  . Anxiety disorder Mother   . Suicidality Father   . Depression Father     Social History Social History   Tobacco Use  . Smoking  status: Never Smoker  . Smokeless tobacco: Never Used  Vaping Use  . Vaping Use: Every day  . Substances: Nicotine, THC, CBD, Flavoring  Substance Use Topics  . Alcohol use: Yes    Comment: social  . Drug use: Never     Allergies   Cephalosporins and Penicillins   Review of Systems Review of Systems  Constitutional: Negative for activity change, appetite change, chills, diaphoresis and fever.  HENT: Positive for ear pain, postnasal drip, rhinorrhea, sneezing and tinnitus. Negative for congestion, ear discharge, hearing loss, sore throat and trouble swallowing.   Eyes: Negative for discharge.  Respiratory: Negative for cough.   Gastrointestinal: Negative for nausea.  Musculoskeletal: Negative for myalgias.  Skin: Negative for rash.  Neurological: Negative for dizziness and headaches.     Physical Exam Triage Vital Signs ED Triage Vitals  Enc Vitals Group     BP 03/30/20 1236 (!) 122/93     Pulse Rate 03/30/20 1236 64     Resp 03/30/20 1236 14     Temp 03/30/20 1236 98.3 F (36.8 C)     Temp Source 03/30/20 1236 Oral     SpO2 03/30/20 1236 100 %     Weight 03/30/20 1234 135 lb (61.2 kg)     Height 03/30/20 1234  5\' 9"  (1.753 m)     Head Circumference --      Peak Flow --      Pain Score 03/30/20 1234 0     Pain Loc --      Pain Edu? --      Excl. in GC? --    No data found.  Updated Vital Signs BP (!) 122/93 (BP Location: Left Arm)   Pulse 64   Temp 98.3 F (36.8 C) (Oral)   Resp 14   Ht 5\' 9"  (1.753 m)   Wt 135 lb (61.2 kg)   SpO2 100%   BMI 19.94 kg/m   Visual Acuity Right Eye Distance:   Left Eye Distance:   Bilateral Distance:    Right Eye Near:   Left Eye Near:    Bilateral Near:     Physical Exam Vitals and nursing note reviewed.  Constitutional:      General: He is not in acute distress.    Appearance: He is normal weight. He is not toxic-appearing.  HENT:     Head: Atraumatic.     Right Ear: Ear canal and external ear normal.      Left Ear: Ear canal and external ear normal.     Ears:     Comments: R TM is a little dull, but gray, L TM is gray and has scarring    Nose: Nose normal.     Mouth/Throat:     Mouth: Mucous membranes are moist.     Pharynx: Oropharynx is clear.  Eyes:     General: No scleral icterus.    Conjunctiva/sclera: Conjunctivae normal.  Cardiovascular:     Rate and Rhythm: Normal rate and regular rhythm.  Pulmonary:     Effort: Pulmonary effort is normal.     Breath sounds: Normal breath sounds.  Musculoskeletal:        General: Normal range of motion.     Cervical back: Neck supple.  Skin:    General: Skin is warm and dry.     Findings: No rash.  Neurological:     Mental Status: He is alert and oriented to person, place, and time.     Gait: Gait normal.  Psychiatric:        Mood and Affect: Mood normal.        Behavior: Behavior normal.        Thought Content: Thought content normal.        Judgment: Judgment normal.      UC Treatments / Results  Labs (all labs ordered are listed, but only abnormal results are displayed) Labs Reviewed - No data to display  EKG   Radiology No results found.  Procedures Procedures (including critical care time)  Medications Ordered in UC Medications - No data to display  Initial Impression / Assessment and Plan / UC Course  I have reviewed the triage vital signs and the nursing notes. Advised to get back on Flonase as noted in instructions.  Final Clinical Impressions(s) / UC Diagnoses   Final diagnoses:  Eustachian tube dysfunction, right     Discharge Instructions     Use the Flonase( fluticasone) 2 sprays each nostril per day for 7 days as needed for allergies and stuffy ears.    ED Prescriptions    None     PDMP not reviewed this encounter.   04/01/20, PA-C 03/30/20 1311

## 2020-03-30 NOTE — Discharge Instructions (Signed)
Use the Flonase( fluticasone) 2 sprays each nostril per day for 7 days as needed for allergies and stuffy ears.

## 2020-03-30 NOTE — ED Triage Notes (Signed)
Patient c/o right ear pain that started yesterday.  Patient also ringing in his right ear.  Patient denies fevers.

## 2020-04-01 ENCOUNTER — Ambulatory Visit: Payer: Medicaid Other | Admitting: Licensed Clinical Social Worker

## 2020-04-01 NOTE — Chronic Care Management (AMB) (Signed)
Care Management   Follow Up Note   04/01/2020 Name: Peter Brady MRN: [id number] DOB: 05/10/2001  Referred by: Tarri Fuller, FNP Reason for referral : Care Coordination   Peter Brady is a 19 y.o. year old male who is a primary care patient of Anitra Lauth, Jodelle Gross, FNP. The care management team was consulted for assistance with care management and care coordination needs.    Review of patient status, including review of consultants reports, relevant laboratory and other test results, and collaboration with appropriate care team members and the patient's provider was performed as part of comprehensive patient evaluation and provision of chronic care management services.    SDOH (Social Determinants of Health) assessments performed: Yes See Care Plan activities for detailed interventions related to [facility])     Advanced Directives: See Care Plan and Vynca application for related entries.   Goals Addressed    .  SW- "I want to work on my mental health" (pt-stated)        CARE PLAN ENTRY (see longitudinal plan of care for additional care plan information)  Current Barriers:  . Financial constraints related to affording care . Limited social support . Transportation . Mental Health Concerns  . Social Isolation . Lacks knowledge of community resource: available mental health providers that accept Medicaid.   Clinical Social Work Clinical Goal(s):   Marland Kitchen Over the next 120 days, patient will work with SW to address concerns related to gaining additional mental health support/resource connection in order to maintain overall health . Over the next 120 days, patient will demonstrate improved adherence to self care as evidenced by implementing healthy self-care into his daily routine such as: attending all medical appointments, deep breathing exercsies, taking time for self-reflection, taking medications as prescribed, drinking water and daily exercise to improve mobility.  . Over the next 120 days,  patient will demonstrate improved health management independence as evidenced by implementing healthy self-care and positive support and mental health resources into his daily routine to cope with stressors and improve overall health and well-being   Interventions: . Inter-disciplinary care team collaboration (see longitudinal plan of care) . Patient interviewed and appropriate assessments performed . Provided mental health counseling with regard to coping with major depressive disorder . Provided patient with information about available mental health resources and support groups. LCSW sent text message to patient with the following resources: . [facility] [employer] - Set designer (Mental Health & Substance Use Services) & [facility] . Mental Health Clinic . Address: [address] [place], [place] [address] . Phone: [phone] . [facility] . [address] . [place], [place]  [address] . Main: [phone] . [facility]: [facility] . Mental health clinic in [place], [place] . Located in: [facility] . Address: [address] [place], [place], [place] [address] . Phone: [phone] . [facility] . Address: [address], [place], [place] [address] . Phone: [phone] . [facility] . Medical clinic in [place], [place] . Address: [address] [place], [place], [place] [address] . Phone: [phone] . [facility] . Mental health service in [place], [place] . Address: [address] [place], [place], [place] [address] . Phone: [phone] . [facility] [employer] . Psychiatrist in [place], [place] . Address: [address] [place], [place] [address] . Appointments: [email or web address] . Phone: [phone]  [phone] . [facility] . [address] [place], [place] [address] . [phone] . [facility] . [address], [place] [address] . [phone] . [employer]  . [address] . [place], [place] [address] . [phone] . [employer]  . Address: [address] [place], [place] [address] . Phone: [phone] . [employer] . [address] . [place], [place] [address] . [phone] . [facility] . Family counselor in [place], [place] . Address: [address], [place], [place] [address] . Phone: [phone] . [employer]. . Address: [address], [place], [place] [address] . Phone: [phone] . [facility] . [phone] . General Email: [email or web address] . HIPAA Compliant Secure Email . [email or web address] [email or web address] . [address] Suite [place], [place] [address] . Civil engineer, contracting  [employer] . Hospice in [place], [place] . Address: [address] [place], [place], [place] [address] . Phone: [phone] . FREE Individual Counseling and Support Groups  . For group support I would call [employer]- [phone]. They have groups for - Anxiety disorders, mood disorders (depression, bipolar), schizophrenia, dual diagnoses (mental illness/substance abuse), eating disorders (anorexia, bulimia) at the [facility].  [facility] ACT Team [phone]- Program has Behavioral Health Outpatient follow up. . [facility] Community Treatment Team in [place] . Our Assertive Community Treatment Team is an evidence-based service designed for adults with serious mental illness that provides a full range of treatment, rehabilitation and support services by an interdisciplinary team in a variety of community based settings. Services include psychiatric and nursing  services, assistance with housing and employment, substance abuse treatment, peer support and case management. Patient must have Medicaid or State funding for this service.  . Discussed plans with patient for ongoing care management follow up and provided patient with direct contact information for care management team . Advised patient to contact [facility], [facility] and [facility] for enrollment. . Assisted patient/caregiver with obtaining information about health plan benefits . LCSW discussed coping skills for anxiety and depression. SW used empathetic and active and reflective listening, validated patient's feelings/concerns, and provided emotional support. LCSW provided self-care education to help manage his conditions and improve his overall mood.  Marland Kitchen Referred patient to [facility] for long term follow up and therapy/counseling . Patient reports that things are going better now that he secured housing and has his own apartment. Patient reports that he was worried at one point that he would become homeless so this is a great relief. . Patient continues to move up in his career and reports that he will be transitioning from hourly pay to salary. Positive reinforcement provided for this achievement. Patient reports that his coworkers even got him clothes, blankets, toiletries for his new apartment which meant a lot to him. Patient has grown a strong support network at his job. . Patient shares that he does not wish to pursue additional mental health support at this time due to being able to manage his depression/anxiety more appropriately now. Patient reports that it would be difficult to find time to seek mental health support due to transportation. LCSW encouraged patient to reconsider additional mental health support such as [facility] if his symptoms increase. Patient was informed that he will need to reschedule office visit for anxiety follow up that was canceled on 03/20/20.  Patient agreeable to contact CP[facility] today.   Patient  Self Care Activities:  . Calls provider office for new concerns or questions . Lacks social connections  Please see past updates related to this goal by clicking on the "Past Updates" button in the selected goal      Dickie La, [place], MSW, LCSW [facility]  [facility] [email or web address] Phone: [phone]

## 2020-04-04 ENCOUNTER — Ambulatory Visit: Payer: Medicaid Other | Admitting: Family Medicine

## 2020-05-27 ENCOUNTER — Telehealth: Payer: Self-pay | Admitting: Licensed Clinical Social Worker

## 2020-05-27 ENCOUNTER — Telehealth: Payer: Self-pay

## 2020-05-27 NOTE — Telephone Encounter (Signed)
  Chronic Care Management    Clinical Social Work General Follow Up Note  05/27/2020 Name: COLTYN HANNING MRN: [id number] DOB: Mar 08, 2001  [name] is a 19 y.o. year old male who is a primary care patient of Anitra Lauth, Jodelle Gross, FNP. The CCM team was consulted for assistance with Mental Health Counseling and Resources.   Review of patient status, including review of consultants reports, relevant laboratory and other test results, and collaboration with appropriate care team members and the patient's provider was performed as part of comprehensive patient evaluation and provision of chronic care management services.    LCSW completed CCM outreach attempt today but was unable to reach patient successfully. A HIPPA compliant voice message was left encouraging patient to return call once available. Patient returned phone call but LCSW was unable to answer. LCSW completed additional outreach to patient that day but was unable to reach him. LCSW will reschedule CCM SW appointment.  Outpatient Encounter Medications as of 05/27/2020  Medication Sig  . busPIRone (BUSPAR) 15 MG tablet Take 1 tablet (15 mg total) by mouth 2 (two) times daily.  . cetirizine (ZYRTEC) 10 MG tablet Take 1 tablet (10 mg total) by mouth daily.  . hydrOXYzine (VISTARIL) 25 MG capsule Take 1 capsule (25 mg total) by mouth at bedtime as needed.   No facility-administered encounter medications on file as of 05/27/2020.    Follow Up Plan: SW will reschedule appointment.   Dickie La, BSW, MSW, LCSW [facility] [place]  [facility] [email or web address] Phone: [phone]

## 2020-06-17 ENCOUNTER — Ambulatory Visit: Payer: Self-pay

## 2020-06-17 NOTE — Chronic Care Management (AMB) (Signed)
  Care Management   Follow Up Note   06/17/2020 Name: Peter Brady MRN: [id number] DOB: 10/14/2000  Referred by: Tarri Fuller, FNP Reason for referral : Care Coordination   Peter Brady is a 19 y.o. year old male who is a primary care patient of Anitra Lauth, Jodelle Gross, FNP. The care management team was consulted for assistance with care management and care coordination needs.    Review of patient status, including review of consultants reports, relevant laboratory and other test results, and collaboration with appropriate care team members and the patient's provider was performed as part of comprehensive patient evaluation and provision of chronic care management services.    LCSW completed CCM outreach attempt today but was unable to reach patient successfully. A HIPPA compliant voice message was left encouraging patient to return call once available. LCSW will  reschedule CCM SW appointment for patient as well.  A HIPAA compliant phone message was left for the patient providing contact information and requesting a return call.   Dickie La, BSW, MSW, LCSW [facility] [place]  [facility] [email or web address] Phone: [phone]

## 2020-07-04 ENCOUNTER — Encounter: Payer: Self-pay | Admitting: Family Medicine

## 2020-07-04 ENCOUNTER — Ambulatory Visit (INDEPENDENT_AMBULATORY_CARE_PROVIDER_SITE_OTHER): Payer: Medicaid Other | Admitting: Family Medicine

## 2020-07-04 ENCOUNTER — Other Ambulatory Visit: Payer: Self-pay

## 2020-07-04 VITALS — BP 134/65 | HR 74 | Temp 98.3°F | Resp 18 | Ht 69.0 in | Wt 161.8 lb

## 2020-07-04 DIAGNOSIS — F419 Anxiety disorder, unspecified: Secondary | ICD-10-CM

## 2020-07-04 DIAGNOSIS — F32A Depression, unspecified: Secondary | ICD-10-CM | POA: Insufficient documentation

## 2020-07-04 DIAGNOSIS — F5104 Psychophysiologic insomnia: Secondary | ICD-10-CM | POA: Insufficient documentation

## 2020-07-04 DIAGNOSIS — R059 Cough, unspecified: Secondary | ICD-10-CM | POA: Insufficient documentation

## 2020-07-04 MED ORDER — MONTELUKAST SODIUM 10 MG PO TABS
10.0000 mg | ORAL_TABLET | Freq: Every day | ORAL | 3 refills | Status: DC
Start: 2020-07-04 — End: 2021-07-13

## 2020-07-04 MED ORDER — ESCITALOPRAM OXALATE 10 MG PO TABS: ORAL_TABLET | ORAL | 1 refills | Status: DC

## 2020-07-04 MED ORDER — HYDROXYZINE PAMOATE 25 MG PO CAPS: 25.0000 mg | ORAL_CAPSULE | Freq: Every evening | ORAL | 1 refills | Status: DC | PRN

## 2020-07-04 MED ORDER — BUSPIRONE HCL 15 MG PO TABS: 15.0000 mg | ORAL_TABLET | Freq: Two times a day (BID) | ORAL | 1 refills | Status: DC

## 2020-07-04 NOTE — Assessment & Plan Note (Signed)
See anxiety A/P. 

## 2020-07-04 NOTE — Assessment & Plan Note (Signed)
See Anxiety A/P 

## 2020-07-04 NOTE — Assessment & Plan Note (Signed)
FXT0-2.  Has been out of buspar x 2 months and has had worsening anxiety and some depression, believes that he depression may be worse than the anxiety.  Is interested in starting on a daily SSRI, will start on escitalopram 5mg  daily and titrate up to 10mg  after 1 week.  Will restart on Buspar 15mg  BID and hydroxyzine 25mg  PO QHS PRN.  Mood handout and sleep hygiene handout provided.  Plan: 1. Begin escitalopram 5mg  daily x 7 days then increase to 10mg  daily 2. Restart buspar 15mg  BID and continue hydroxyzine 25mg  PO QHS PRN for insomnia 3. Review mood and sleep hygiene handout 4. RTC in 4 weeks

## 2020-07-04 NOTE — Assessment & Plan Note (Signed)
Will continue zyrtec 10mg  daily and flonase as directed and will begin Singulair 10mg  nightly.  Discussed will re-evaluate in 4 weeks and if still having continued symptoms will refer to pulmonary for PFTs and evaluation.

## 2020-07-04 NOTE — Patient Instructions (Signed)
I have sent in a prescription for escitalopram to begin taking 5mg (1/2 tablet) for the next 7 days, then can increase to 10mg daily.  As we discussed, this medication may make you tired, you can take this before bed.  With any dose change on this medication, you may have an increase in your anxiety.  This is expected, but should level out once you have been on the medications for a few weeks.  I have sent in a prescription for Singulair to take 1 tablet before bed to help with your persistent cough.  If we find no improvement with this medication over the next month, we will put in a referral to pulmonary for pulmonary function testing.  The following recommendations are helpful adjuncts for helping rebalance your mood.  Eat a nourishing diet. Ensure adequate intake of calories, protein, carbs, fat, vitamins, and minerals. Prioritize whole foods at each meal, including meats, vegetables, fruits, nuts and seeds, etc.   Avoid inflammatory and/or "junk" foods, such as sugar, omega-6 fats, refined grains, chemicals, and preservatives are common in packaged and prepared foods. Minimize or completely avoid these ingredients and stick to whole foods with little to no additives. Cook from scratch as much as possible for more control over what you eat  Get enough sleep. Poor sleep is significantly associated with depression and anxiety. Make 7-9 hours of sleep nightly a top priority  Exercise appropriately. Exercise is known to improve brain functioning and boost mood. Aim for 30 minutes of daily physical activity. Avoid "overtraining," which can cause mental disturbances  Assess your light exposure. Not enough natural light during the day and too much artificial light can have a major impact on your mood. Get outside as often as possible during daylight hours. Minimize light exposure after dark and avoid the use of electronics that give off blue light before bed  Manage your stress.  Use daily stress  management techniques such as meditation, yoga, or mindfulness to retrain your brain to respond differently to stress. Try deep breathing to deactivate your "fight or flight" response.  There are many of sources with apps like Headspace, Calm or a variety of YouTube videos (videos from Jason Stephenson have guided meditation)  Prioritize your social life. Work on building social support with new friends or improve current relationships. Consider getting a pet that allows for companionship, social interaction, and physical touch. Try volunteering or joining a faith-based community to increase your sense of purpose  4-7-8 breathing technique at bedtime: breathe in to count of 4, hold breath for count of 7, exhale for count of 8; do 3-5 times for letting go of overactive thoughts  Take time to play Unstructured "play" time can help reduce anxiety and depression Options for play include music, games, sports, dance, art, etc.  Try to add daily omega 3 fatty acids, magnesium, B complex, and balanced amino acid supplements to help improve mood and anxiety.  Sleep hygiene is the single most effective treatment for sleep issues, but it is hard work.  Tips for a good night's sleep:  -Keep sleep environment comfortable and conducive to sleep -Keep regular sleep schedule 7 nights a week -Avoiding naps during the day -Avoiding going to bed until drowsy and ready to sleep, not trying to sleep, and not watching the clock -Get out of bed if not asleep within 15-20 minutes and returning only when drowsy -Avoiding caffeine, nicotine, alcohol, and other substances that interfere with sleep before bedtime -Take an hour before your   set bedtime and start to wind down: bath/shower, no more TV or phone (the blue light can interfere with sleeping), listen to soothing music, or meditation -No TV in your bedroom -Exercising regularly, at least 6 hours before sleep. Yoga and Tai Chi can improve sleep quality  There are  a lot of books and apps that may help guide you with any of the following:   -Progressive muscle relaxation (involves methodical tension and relaxation of different Muscle groups throughout body)  Guided imagery  -YouTube - Gwynne Edinger has free videos on YouTube that can help with meditation and some   Abdominal breathing   Over the counter sleep aid one hour before bed- and gradually wean your use over 2-4 weeks  Some examples are : *Melatonin 5-10 mg *Sleepology (Can find on [employer]) taken according to packaging directions  There are a few online evidence based online programs, unfortunately they are not free.   Developed by a sleep expert who created a drug-free program for insomnia proven more effective than sleeping pills.  www.cbtforinsomnia.com Sleepio is an evidence-based digital sleep improvement program   www.sleepio.com SHUTi is designed to actively help retrain your body and mind for great sleep through six engaging Cognitive Behavioral Therapy for Insomnia strategy and learning sessions  [email or web address]  We will plan to see you back in 4 weeks for medication follow up visit  You will receive a survey after today's visit either digitally by e-mail or paper by [employer]. Your experiences and feedback matter to [place].  Please respond so we know how we are doing as we provide care for you.  Call us with any questions/concerns/needs.  It is my goal to be available to you for your health concerns.  Thanks for choosing me to be a partner in your healthcare needs!  Harlin Rain, FNP-C Family Nurse Practitioner [facility] Phone: [phone]

## 2020-07-04 NOTE — Progress Notes (Signed)
Subjective:    Patient ID: Peter Brady, male    DOB: 2000/09/12, 19 y.o.   MRN: [id number]  Peter Brady is a 19 y.o. male presenting on 07/04/2020 for Anxiety   HPI  Peter Brady presents to clinic for follow up visit on his anxiety.  Reports he has been out of his buspar for approx 2 months but did not know how to get a refill.  Has found that his anxiety has increased, as well as his depression, that he believes his depression is worse than his anxiety.  Is interested in starting on a daily medication to help with both.  Denies SI/HI.  Reports he has had a cough for the past few years, he has tried zyrtec and flonase.  Has found it has helped some, but has not made his cough resolve.  Denies any history of asthma, reactive airway disease, or pulmonary diagnosis/evaluation.  Depression screen [facility] 2/9 07/04/2020 03/18/2020 02/18/2020  Decreased Interest 0 1 3  Down, Depressed, Hopeless 1 1 3   PHQ - 2 Score 1 2 6   Altered sleeping 1 3 3   Tired, decreased energy 1 0 0  Change in appetite 2 3 3   Feeling bad or failure about yourself  1 1 -  Trouble concentrating 0 1 0  Moving slowly or fidgety/restless 0 0 0  Suicidal thoughts 1 1 0  PHQ-9 Score 7 11 12   Difficult doing work/chores Somewhat difficult Very difficult Extremely dIfficult    Social History   Tobacco Use  . Smoking status: Never Smoker  . Smokeless tobacco: Never Used  Vaping Use  . Vaping Use: Every day  . Substances: Nicotine, Flavoring  Substance Use Topics  . Alcohol use: Yes    Comment: social  . Drug use: Never    Review of Systems  Constitutional: Negative.   HENT: Negative.   Eyes: Negative.   Respiratory: Positive for cough. Negative for apnea, choking, chest tightness, shortness of breath, wheezing and stridor.   Cardiovascular: Negative.   Gastrointestinal: Negative.   Endocrine: Negative.   Genitourinary: Negative.   Musculoskeletal: Negative.   Skin: Negative.   Allergic/Immunologic: Negative.     Neurological: Negative.   Hematological: Negative.   Psychiatric/Behavioral: Positive for dysphoric mood and sleep disturbance. Negative for agitation, behavioral problems, confusion, decreased concentration, hallucinations, self-injury and suicidal ideas. The patient is nervous/anxious. The patient is not hyperactive.    Per HPI unless specifically indicated above     Objective:    BP 134/65 (BP Location: Left Arm, Patient Position: Sitting, Cuff Size: Normal)   Pulse 74   Temp 98.3 F (36.8 C) (Oral)   Resp 18   Ht 5\' 9"  (1.753 m)   Wt 161 lb 12.8 oz (73.4 kg)   SpO2 100%   BMI 23.89 kg/m   Wt Readings from Last 3 Encounters:  07/04/20 161 lb 12.8 oz (73.4 kg)  03/30/20 135 lb (61.2 kg) (17 %, Z= -0.94)*  03/18/20 135 lb (61.2 kg) (17 %, Z= -0.94)*   * Growth percentiles are based on CDC (Boys, 2-20 Years) data.    Physical Exam Vitals and nursing note reviewed.  Constitutional:      General: He is not in acute distress.    Appearance: Normal appearance. He is well-developed, well-groomed and normal weight. He is not ill-appearing or toxic-appearing.  HENT:     Head: Normocephalic and atraumatic.     Nose:     Comments: is in place, covering mouth and  nose. Eyes:     General:        Right eye: No discharge.        Left eye: No discharge.     Extraocular Movements: Extraocular movements intact.     Conjunctiva/sclera: Conjunctivae normal.     Pupils: Pupils are equal, round, and reactive to light.  Cardiovascular:     Rate and Rhythm: Normal rate and regular rhythm.     Pulses: Normal pulses.     Heart sounds: Normal heart sounds. No murmur heard.  No friction rub. No gallop.   Pulmonary:     Effort: Pulmonary effort is normal. No respiratory distress.     Breath sounds: Normal breath sounds.  Skin:    General: Skin is warm and dry.     Capillary Refill: Capillary refill takes less than 2 seconds.  Neurological:     General: No focal deficit present.      Mental Status: He is alert and oriented to person, place, and time.  Psychiatric:        Attention and Perception: Attention and perception normal.        Mood and Affect: Mood and affect normal.        Speech: Speech normal.        Behavior: Behavior normal. Behavior is cooperative.        Thought Content: Thought content normal.        Cognition and Memory: Cognition and memory normal.        Judgment: Judgment normal.    Results for orders placed or performed in visit on 02/18/20  POCT Urinalysis Dipstick  Result Value Ref Range   Color, UA Yellow    Clarity, UA clear    Glucose, UA Negative Negative   Bilirubin, UA negative    Ketones, UA negative    Spec Grav, UA 1.025 1.010 - 1.025   Blood, UA negative    pH, UA 5.0 5.0 - 8.0   Protein, UA Negative Negative   Urobilinogen, UA 0.2 0.2 or 1.0 E.U./dL   Nitrite, UA negative    Leukocytes, UA Negative Negative   Appearance     Odor        Assessment & Plan:   Problem List Items Addressed This Visit      Other   Anxiety - Primary    PHQ9-7.  Has been out of buspar x 2 months and has had worsening anxiety and some depression, believes that he depression may be worse than the anxiety.  Is interested in starting on a daily SSRI, will start on escitalopram 5mg  daily and titrate up to 10mg  after 1 week.  Will restart on Buspar 15mg  BID and hydroxyzine 25mg  PO QHS PRN.  Mood handout and sleep hygiene handout provided.  Plan: 1. Begin escitalopram 5mg  daily x 7 days then increase to 10mg  daily 2. Restart buspar 15mg  BID and continue hydroxyzine 25mg  PO QHS PRN for insomnia 3. Review mood and sleep hygiene handout 4. RTC in 4 weeks      Relevant Medications   hydrOXYzine (VISTARIL) 25 MG capsule   busPIRone (BUSPAR) 15 MG tablet   escitalopram (LEXAPRO) 10 MG tablet   Psychophysiological insomnia    See anxiety AP      Relevant Medications   hydrOXYzine (VISTARIL) 25 MG capsule   Cough    Will continue zyrtec  10mg  daily and flonase as directed and will begin Singulair 10mg  nightly.  Discussed will re-evaluate in 4 weeks and if  still having continued symptoms will refer to pulmonary for PFTs and evaluation.      Relevant Medications   montelukast (SINGULAIR) 10 MG tablet   Depression    See Anxiety A/P      Relevant Medications   hydrOXYzine (VISTARIL) 25 MG capsule   busPIRone (BUSPAR) 15 MG tablet   escitalopram (LEXAPRO) 10 MG tablet      Meds ordered this encounter  Medications  . hydrOXYzine (VISTARIL) 25 MG capsule    Sig: Take 1 capsule (25 mg total) by mouth at bedtime as needed.    Dispense:  90 capsule    Refill:  1  . busPIRone (BUSPAR) 15 MG tablet    Sig: Take 1 tablet (15 mg total) by mouth 2 (two) times daily.    Dispense:  60 tablet    Refill:  1  . escitalopram (LEXAPRO) 10 MG tablet    Sig: Take 0.5 tablets (5 mg total) by mouth daily for 7 days, THEN 1 tablet (10 mg total) daily.    Dispense:  90 tablet    Refill:  1  . montelukast (SINGULAIR) 10 MG tablet    Sig: Take 1 tablet (10 mg total) by mouth at bedtime.    Dispense:  30 tablet    Refill:  3   Follow up plan: Return in about 4 weeks (around 08/01/2020) for Medication follow up .   Charlaine Dalton, FNP Family Nurse Practitioner [facility] 07/04/2020, 4:41 PM

## 2020-07-10 ENCOUNTER — Telehealth: Payer: Self-pay | Admitting: Licensed Clinical Social Worker

## 2020-07-10 ENCOUNTER — Telehealth: Payer: Self-pay

## 2020-07-10 NOTE — Telephone Encounter (Signed)
  Chronic Care Management    Clinical Social Work General Follow Up Note  07/10/2020 Name: Peter Brady MRN: [id number] DOB: 06-06-2001  [name] is a 19 y.o. year old male who is a primary care patient of Anitra Lauth, Jodelle Gross, FNP. The CCM team was consulted for assistance with Mental Health Counseling and Resources.   Review of patient status, including review of consultants reports, relevant laboratory and other test results, and collaboration with appropriate care team members and the patient's provider was performed as part of comprehensive patient evaluation and provision of chronic care management services.    LCSW completed CCM outreach attempt today but was unable to reach patient successfully. A HIPPA compliant voice message was left encouraging patient to return call once available. LCSW will ask Scheduling Care Guide to reschedule CCM SW appointment with patient as well   Outpatient Encounter Medications as of 07/10/2020  Medication Sig  . busPIRone (BUSPAR) 15 MG tablet Take 1 tablet (15 mg total) by mouth 2 (two) times daily.  . cetirizine (ZYRTEC) 10 MG tablet Take 1 tablet (10 mg total) by mouth daily.  Marland Kitchen escitalopram (LEXAPRO) 10 MG tablet Take 0.5 tablets (5 mg total) by mouth daily for 7 days, THEN 1 tablet (10 mg total) daily.  . fluticasone (FLONASE) 50 MCG/ACT nasal spray Place into both nostrils daily.  . hydrOXYzine (VISTARIL) 25 MG capsule Take 1 capsule (25 mg total) by mouth at bedtime as needed.  . montelukast (SINGULAIR) 10 MG tablet Take 1 tablet (10 mg total) by mouth at bedtime.   No facility-administered encounter medications on file as of 07/10/2020.    Follow Up Plan: Scheduling Care Guide will reach out to patient to reschedule appointment.   Dickie La, BSW, MSW, LCSW [facility] [place]  [facility] [email or web address] Phone: [phone]

## 2020-07-11 NOTE — Telephone Encounter (Signed)
Pt has been r/s  

## 2020-07-29 ENCOUNTER — Ambulatory Visit: Payer: Medicaid Other | Admitting: Licensed Clinical Social Worker

## 2020-07-29 NOTE — Chronic Care Management (AMB) (Signed)
Care Management   Follow Up Note   07/29/2020 Name: Peter Brady MRN: [id number] DOB: Jan 20, 2001  Peter Brady is enrolled in a Managed Medicaid plan: Yes. Outreach attempt today was successful.    Referred by: Tarri Fuller, FNP Reason for referral : Care Coordination   Peter Brady is a 19 y.o. year old male who is a primary care patient of Anitra Lauth, Jodelle Gross, FNP. The care management team was consulted for assistance with care management and care coordination needs.    Review of patient status, including review of consultants reports, relevant laboratory and other test results, and collaboration with appropriate care team members and the patient's provider was performed as part of comprehensive patient evaluation and provision of chronic care management services.    Goals Addressed    .  SW- "I want to work on my mental health" (pt-stated)        CARE PLAN ENTRY (see longitudinal plan of care for additional care plan information)  Current Barriers:  . Financial constraints related to affording care . Limited social support . Transportation . Mental Health Concerns  . Social Isolation . Lacks knowledge of community resource: available mental health providers that accept Medicaid.   Clinical Social Work Clinical Goal(s):   Marland Kitchen Over the next 120 days, patient will work with SW to address concerns related to gaining additional mental health support/resource connection in order to maintain overall health . Over the next 120 days, patient will demonstrate improved adherence to self care as evidenced by implementing healthy self-care into his daily routine such as: attending all medical appointments, deep breathing exercsies, taking time for self-reflection, taking medications as prescribed, drinking water and daily exercise to improve mobility.  . Over the next 120 days, patient will demonstrate improved health management independence as evidenced by implementing healthy self-care and  positive support and mental health resources into his daily routine to cope with stressors and improve overall health and well-being   Interventions: . Inter-disciplinary care team collaboration (see longitudinal plan of care) . Patient interviewed and appropriate assessments performed . Patient has upcoming PCP appointment on 08/08/20. He shares that his current medications are effectively working for him.  . Provided mental health counseling with regard to coping with major depressive disorder . Provided patient with information about available mental health resources and support groups. LCSW sent text message to patient with the following resources: . [facility] [employer] - Set [facility] (Mental Health & Substance Use Services) & [facility] . Mental Health Clinic . Address: [address] [place], [place] [address] . Phone: [phone] . [facility] . [address] . [place], [place]  [address] . Main: [phone] . Film/video editor [facility] . Mental health clinic in [place], [place] . Located in: [facility] . Address: [address] [place], [place], [place] [address] . Phone: [phone] . [facility] . Address: [address] [place], [place] [address] . Phone: [phone] . [facility] . Medical clinic in [place], [place] . Address: [address] [place], [place], [place] [address] . Phone: [phone] . [facility] . Mental health service in [place], [place] . Address: [address] [place], [place], [place] [address] . Phone: [phone] . [facility] [employer] . Psychiatrist in [place], [place] . Address: [address], [place], [place] [address] .  Appointments: [email or web address] . Phone: [phone] . [facility] . [address] [place], [place] [address] . [phone] . [facility] . [address], [place] [address] . [phone] . [employer]  . [address] . [place], [place] [address] . [phone] . [facility]  . Address: [address], [place], [place] [address] . Phone: [phone] . [employer] . [address] . [place], [place] [address] . [phone] . [facility] . Family counselor in [place], [place] . Address: [address] [place], [place] [address] . Phone: [phone] . [employer]. . Address: [address] [place], [place] [address] . Phone: [phone] . [facility] [employer] . [phone] . General Email: [email or web address] .net . HIPAA Compliant Secure Email . [email or web address] [email or web address] . [address] [place], [place] [address] . Civil engineer, contracting  [employer] . Hospice in [place], [place] . Address: [address] [place], [place], [place] [address] . Phone: [phone] . FREE Individual Counseling and Support Groups  . For group support I would call [employer]- [phone]. They have groups for - Anxiety disorders, mood disorders (depression, bipolar), schizophrenia, dual diagnoses (mental illness/substance abuse), eating disorders (anorexia, bulimia) at the [facility].  Frederich Chick ACT Team [phone]- Program has Behavioral Health Outpatient follow up. . Assertive Community Treatment Team in [place] . Our Assertive Community Treatment Team is an evidence-based service designed for adults with serious mental illness that provides a full range of treatment, rehabilitation and support services by an interdisciplinary team in a variety of community based settings. Services include psychiatric and nursing  services, assistance with housing and employment, substance abuse treatment, peer support and case management. Patient must have Medicaid or State funding for this service.  . Discussed plans with patient for ongoing care management follow up and provided patient with direct contact information for care management team . Advised patient to contact [facility], [facility] and [facility] for enrollment but patient wishes to wait on this resource implementation.  . Assisted patient/caregiver with obtaining information about health plan benefits . LCSW discussed coping skills for anxiety and depression. SW used empathetic and active and reflective listening, validated patient's feelings/concerns, and provided emotional support. LCSW provided self-care education to help manage his conditions and improve his overall mood.  . Patient reports that things are going better now that he secured housing and has his own apartment. Patient reports that he was worried at one point that he would become homeless so this is a great relief. . Patient continues to move up in his career and reports that he will be transitioning from hourly pay to salary. Positive reinforcement provided for this achievement. Patient reports that his coworkers even got him clothes, blankets, toiletries for his new apartment which meant a lot to him. Patient has grown a strong support network at his job. . Patient shares that he does not wish to pursue additional mental health support at this time due to not having a car. He plans on getting a car in February of 2022.  Patient reports that it would be difficult to find time to seek mental health support due to transportation but he will consider this resource after the new year. LCSW encouraged patient to reconsider additional mental health support such as [facility] if his symptoms increase. . Patient reports that his insurance may change from Medicaid to his employer's  insurance  soon. He is agreeable to keep team updated on this.   Patient Self Care Activities:  . Calls provider office for new concerns or questions . Lacks social connections  Please see past updates related to this goal by clicking on the "Past Updates" button in the selected goal      The care management team will reach out to the patient again over the next 90 days.   Dickie La, BSW, MSW, LCSW [facility]   [email or web address] Phone: [phone]

## 2020-08-08 ENCOUNTER — Ambulatory Visit: Payer: Medicaid Other | Admitting: Family Medicine

## 2020-08-15 ENCOUNTER — Ambulatory Visit: Payer: Medicaid Other | Admitting: Family Medicine

## 2020-10-14 ENCOUNTER — Ambulatory Visit: Payer: Self-pay | Admitting: Licensed Clinical Social Worker

## 2020-10-14 NOTE — Chronic Care Management (AMB) (Signed)
Care Management Clinical Social Work Note  10/14/2020 Name: Peter Brady MRN: [id number] DOB: 04-05-2001  Peter Brady is a 20 y.o. year old male who is a primary care patient of Anitra Lauth, Jodelle Gross, FNP.  The Care Management team was consulted for assistance with chronic disease management and coordination needs.  Engaged with patient by telephone for follow up visit in response to provider referral for social work chronic care management and care coordination services  Consent to Services:  Peter Brady was given information about Care Management services today including:  1. Care Management services includes personalized support from designated clinical staff supervised by his physician, including individualized plan of care and coordination with other care providers 2. 24/7 contact phone numbers for assistance for urgent and routine care needs. 3. The patient may stop case management services at any time by phone call to the office staff.  Patient agreed to services and consent obtained.   Assessment: Review of patient past medical history, allergies, medications, and health status, including review of relevant consultants reports was performed today as part of a comprehensive evaluation and provision of chronic care management and care coordination services.  SDOH (Social Determinants of Health) assessments and interventions performed:    Advanced Directives Status: See Care Plan for related entries.  Care Plan  Allergies  Allergen Reactions  . Cephalosporins Rash  . Penicillins Rash    Outpatient Encounter Medications as of 10/14/2020  Medication Sig  . busPIRone (BUSPAR) 15 MG tablet Take 1 tablet (15 mg total) by mouth 2 (two) times daily.  . cetirizine (ZYRTEC) 10 MG tablet Take 1 tablet (10 mg total) by mouth daily.  Marland Kitchen escitalopram (LEXAPRO) 10 MG tablet Take 0.5 tablets (5 mg total) by mouth daily for 7 days, THEN 1 tablet (10 mg total) daily.  . fluticasone (FLONASE) 50 MCG/ACT nasal  spray Place into both nostrils daily.  . hydrOXYzine (VISTARIL) 25 MG capsule Take 1 capsule (25 mg total) by mouth at bedtime as needed.  . montelukast (SINGULAIR) 10 MG tablet Take 1 tablet (10 mg total) by mouth at bedtime.   No facility-administered encounter medications on file as of 10/14/2020.    Patient Active Problem List   Diagnosis Date Noted  . 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 Cough 07/04/2020  . Depression 07/04/2020  . Pain in left testicle 03/18/2020  . History of suicide attempt 02/18/2020  . Family history of suicide attempt 02/18/2020  . Vitamin D deficiency 12/20/2016  . Allergic rhinitis 12/20/2016  . Anxiety 12/13/2016  . Cannabis use disorder, mild, abuse 12/13/2016  . Major depressive episode 12/13/2016    Conditions to be addressed/monitored: Anxiety and Depression; Mental Health Concerns   Care Plan : General Social Work (Adult)  Updates made by Gustavus Bryant, LCSW since 10/14/2020 12:00 AM    Problem: Response to Treatment (Depression)     Long-Range Goal: Response to Treatment Maximized   Start Date: 10/14/2020  Priority: Medium  Note:   Evidence-based guidance:   Engage patient in conversation about the perceived benefits of mental health treatment and quality of therapeutic alliance with his/her mental health professional.   Assess for barriers to attending appointments, such as transportation, financial, sense of slow or little improvement and forgetfulness.   Consider patient resistance to treatment based on stigma related to mental health diagnosis.   Maintain a documented system of ongoing contacts with patient during the first 6 to 12 months of treatment, as missed appointments and disengagement may signal  deteriorating condition.   Provide anticipatory guidance about the risk of increased symptoms and potential psychiatric hospitalization for those who have a pattern of nonattendance at mental health appointments.   Re-screen for  depressive symptoms at mutually identified intervals.   Notes:    Timeframe:  Long-Range Goal Priority:  Medium  Start Date:  10/14/20                         Expected End Date:  01/10/21                    Follow Up Date- within 30 days of 10/14/20   CARE PLAN ENTRY (see longitudinal plan of care for additional care plan information)  Current Barriers:  . Financial constraints related to affording care . Limited social support . Transportation . Mental Health Concerns  . Social Isolation . Lacks knowledge of community resource: available mental health providers that accept Medicaid.   Clinical Social Work Clinical Goal(s):   Marland Kitchen Over the next 120 days, patient will work with SW to address concerns related to gaining additional mental health support/resource connection in order to maintain overall health . Over the next 120 days, patient will demonstrate improved adherence to self care as evidenced by implementing healthy self-care into his daily routine such as: attending all medical appointments, deep breathing exercsies, taking time for self-reflection, taking medications as prescribed, drinking water and daily exercise to improve mobility.  . Over the next 120 days, patient will demonstrate improved health management independence as evidenced by implementing healthy self-care and positive support and mental health resources into his daily routine to cope with stressors and improve overall health and well-being   Interventions: . Inter-disciplinary care team collaboration (see longitudinal plan of care) . Patient interviewed and appropriate assessments performed . Patient has no future upcoming medical appointments. He shares that his current medications are effectively working for him.  . Patient is no longer taking Buspar and is interested in restarting medication. Patient will contact [facility] to inquire about this. Patient reports that his Lexapro medication is successfully treating his  depression but not his anxiety and he does not want to pursue mental health treatment at this time. "I have too much going on right now to go there." . Provided mental health counseling with regard to coping with major depressive disorder . Provided patient with information about available mental health resources and support groups. LCSW sent text message to patient with the following resources: . [facility] [employer] - [facility] (Mental Health & Substance Use Services) & [facility] . Mental Health Clinic . Address: [address], [place], [place] [address] . Phone: [phone] . [facility] . [address] . [place], [place]  [address] . Main: [phone] . Film/video editor [facility] . Mental health clinic in [place], [place] . Located in: [facility] . Address: [address] [place], [place], [place] [address] . Phone: [phone] . [facility] . Address: [address], [place], [place] [address] . Phone: [phone] . [facility] . Medical clinic in [place], [place] . Address: [address] [place], [place], [place] [address] . Phone: [phone] . [facility] . Mental health service in [place], [place] . Address: [address] [place], [place], [place] [address] . Phone: [phone] . [facility] . Psychiatrist in [place], [place] . Address: [address], [place], [place] [address] . Appointments: [email or web address] . Phone: [phone] . [facility] . [address] [place], [place] [address] . [phone] . [facility] . [address], [place] [address] . [phone] . [employer]  . [address] . [place], [place]  [place] [address] . [phone] . [employer]  . Address: [address] [place], [place] [address] . Phone: [phone] . [employer] . [address] . [place], [place] [address] . [phone] . [facility] . Family counselor in [place], [place] . Address: [address], [place], [place] [address] . Phone: [phone] . [employer]. . Address: [address], [place], [place] [address] . Phone: [phone] . [employer] . [phone] . General Email: [email or web address] .net . HIPAA Compliant Secure Email . Info@[facility] .com . [address] [place], [place] [address] . Civil engineer, contracting  [employer] . Hospice in [place], [place] . Address: [address] [place], [place], [place] [address] . Phone: [phone] . FREE Individual Counseling and Support Groups  . For group support I would call [employer]- [phone]. They have groups for - Anxiety disorders, mood disorders (depression, bipolar), schizophrenia, dual diagnoses (mental illness/substance abuse), eating disorders (anorexia, bulimia) at the [facility].  Frederich Chick ACT Team [phone]- Program has Behavioral Health Outpatient follow up. . Assertive Community Treatment Team in [place] . Our Assertive Community Treatment Team is an evidence-based service designed for adults with serious mental illness that provides a full range of treatment, rehabilitation and support services by an interdisciplinary team in a variety of community based settings. Services include psychiatric and nursing services, assistance with housing and employment, substance abuse treatment, peer support and case management. Patient must have Medicaid or State funding for this service.  . Discussed plans with patient for ongoing care management follow up and provided patient with  direct contact information for care management team . Advised patient to contact [facility], [employer] and [facility] for enrollment but patient wishes to wait on this resource implementation.  . Assisted patient/caregiver with obtaining information about health plan benefits . LCSW discussed coping skills for anxiety and depression. SW used empathetic and active and reflective listening, validated patient's feelings/concerns, and provided emotional support. LCSW provided self-care education to help manage his conditions and improve his overall mood.  . Patient reports that things are going better now that he secured housing and has his own apartment. Patient reports that he was worried at one point that he would become homeless so this is a great relief. . Patient continues to move up in his career and reports that he will be transitioning from hourly pay to salary. Positive reinforcement provided for this achievement. Patient reports that his coworkers even got him clothes, blankets, toiletries for his new apartment which meant a lot to him. Patient has grown a strong support network at his job. . Patient shares that he does not wish to pursue additional mental health support at this time due to not having a car. He plans on getting a car in February of 2022.  Patient reports that it would be difficult to find time to seek mental health support due to transportation but he will consider this resource after the new year. LCSW encouraged patient to reconsider additional mental health support such as [facility]  if his symptoms increase. UPDATE 10/14/20- Patient now has stable transportation. He bought a Moped this month.  . Patient reports that his insurance may change from Medicaid to his employer's insurance soon. He is agreeable to keep team updated on this.   Patient Self Care Activities:  . Calls provider office for new concerns or questions . Lacks social connections  Please see  past updates related to this goal by clicking on the "Past Updates" button in the selected goal    Task: Facilitate Engagement in Mental Health Services   Note:   Care Management Activities:    - barriers to treatment reviewed and addressed - perceived benefits to therapy discussed - risk of unmanaged depression discussed    Notes:       Follow Up Plan: SW will follow up with patient by phone over the next 30 days  Dickie La, [employer], MSW, LCSW [facility]  [email or web address] Phone: [phone]

## 2020-11-05 ENCOUNTER — Telehealth: Payer: Self-pay

## 2020-11-05 ENCOUNTER — Telehealth: Payer: Self-pay | Admitting: Licensed Clinical Social Worker

## 2020-11-05 NOTE — Telephone Encounter (Signed)
  Chronic Care Management    Clinical Social Work General Follow Up Note  11/05/2020 Name: Peter Brady MRN: [id number] DOB: 2001-03-31  Peter Brady is a 20 y.o. year old male who is a primary care patient of Anitra Lauth, Jodelle Gross, FNP. The CCM team was consulted for assistance with Mental Health Counseling and Resources.   Review of patient status, including review of consultants reports, relevant laboratory and other test results, and collaboration with appropriate care team members and the patient's provider was performed as part of comprehensive patient evaluation and provision of chronic care management services.    LCSW completed CCM outreach attempt today but was unable to reach patient successfully. A HIPPA compliant voice message was left encouraging patient to return call once available. LCSW will ask Scheduling Care Guide to reschedule CCM SW appointment with patient as well.  Outpatient Encounter Medications as of 11/05/2020  Medication Sig  . busPIRone (BUSPAR) 15 MG tablet Take 1 tablet (15 mg total) by mouth 2 (two) times daily.  . cetirizine (ZYRTEC) 10 MG tablet Take 1 tablet (10 mg total) by mouth daily.  Marland Kitchen escitalopram (LEXAPRO) 10 MG tablet Take 0.5 tablets (5 mg total) by mouth daily for 7 days, THEN 1 tablet (10 mg total) daily.  . fluticasone (FLONASE) 50 MCG/ACT nasal spray Place into both nostrils daily.  . hydrOXYzine (VISTARIL) 25 MG capsule Take 1 capsule (25 mg total) by mouth at bedtime as needed.  . montelukast (SINGULAIR) 10 MG tablet Take 1 tablet (10 mg total) by mouth at bedtime.   No facility-administered encounter medications on file as of 11/05/2020.    Follow Up Plan: Scheduling Care Guide will reach out to patient to reschedule appointment.   Dickie La, BSW, MSW, LCSW [facility] [place]  [facility] [email or web address] Phone: [phone]

## 2020-11-11 ENCOUNTER — Ambulatory Visit: Payer: Self-pay | Admitting: Licensed Clinical Social Worker

## 2020-11-11 NOTE — Chronic Care Management (AMB) (Addendum)
  Care Management   Follow Up Note   11/11/2020 Name: Peter Brady MRN: [id number] DOB: 2001/03/02  Referred by: Tarri Fuller, FNP Reason for referral : Care Coordination   Peter Brady is a 19 y.o. year old male who is a primary care patient of Anitra Lauth, Jodelle Gross, FNP. The care management team was consulted for assistance with care management and care coordination needs.    Review of patient status, including review of consultants reports, relevant laboratory and other test results, and collaboration with appropriate care team members and the patient's provider was performed as part of comprehensive patient evaluation and provision of chronic care management services.    LCSW completed CCM outreach attempt today but was unable to reach patient successfully. A HIPPA compliant voice message was left encouraging patient to return call once available. LCSW will reschedule patient's CCM Social Work appointment if no return call has been made.  A HIPPA compliant phone message was left for the patient providing contact information and requesting a return call.   Dickie La, BSW, MSW, LCSW [facility]  [facility] [email or web address] Phone: [phone]

## 2020-11-27 ENCOUNTER — Telehealth: Payer: Self-pay | Admitting: *Deleted

## 2020-11-27 NOTE — Telephone Encounter (Signed)
Patient declined rescheduling  

## 2020-11-27 NOTE — Telephone Encounter (Signed)
Please reschedule LCSW [facility] 

## 2020-11-27 NOTE — Chronic Care Management (AMB) (Signed)
  Care Management   Note  11/27/2020 Name: Peter Brady MRN: [id number] DOB: Nov 28, 2000  Peter Brady is a 20 y.o. year old male who is a primary care patient of Anitra Lauth, Jodelle Gross, FNP and is actively engaged with the care management team. I reached out to Peter Brady by phone today to assist with re-scheduling a follow up visit with the Licensed Clinical Social Worker  Follow up plan: Patient declines further follow up and engagement by the care management team. Appropriate care team members and provider have been notified via electronic communication.   Yulisa Chirico [facility] Guide, Embedded Care Coordination [facility] Management

## 2021-01-11 ENCOUNTER — Ambulatory Visit
Admission: EM | Admit: 2021-01-11 | Discharge: 2021-01-11 | Disposition: A | Discharge status: Home / Self Care | Payer: Commercial Managed Care - PPO | Attending: Emergency Medicine | Admitting: Emergency Medicine

## 2021-01-11 ENCOUNTER — Other Ambulatory Visit: Payer: Self-pay

## 2021-01-11 ENCOUNTER — Ambulatory Visit: Payer: Self-pay

## 2021-01-11 DIAGNOSIS — M79672 Pain in left foot: Secondary | ICD-10-CM

## 2021-01-11 DIAGNOSIS — T148XXA Other injury of unspecified body region, initial encounter: Secondary | ICD-10-CM

## 2021-01-11 MED ORDER — HYDROCODONE-ACETAMINOPHEN 5-325 MG PO TABS: 1.0000 | ORAL_TABLET | Freq: Four times a day (QID) | ORAL | 0 refills | Status: DC | PRN

## 2021-01-11 NOTE — Discharge Instructions (Addendum)
Keep your road rash clean and dry but you can quit applying antibiotic ointment to your forearms.  There is a few areas in the center of your low back that are still slightly red so you can continue applying the antibiotic ointment there.  Take the ibuprofen 600 mg as prescribed by the ER with food every 6 hours to help you with pain and inflammation.  Use a cold soda can, or frozen bottle of water, to ice the arch of your left foot 2-3 times a day to help with the bruising.  Wear shoes with good arch support to help support the structures of your foot as they heal.  If you develop any increased redness, swelling, drainage, or fever return for reevaluation.
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
## 2021-01-30 ENCOUNTER — Ambulatory Visit: Payer: Medicaid Other | Admitting: Internal Medicine
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
## 2021-02-01 ENCOUNTER — Encounter: Payer: Self-pay | Admitting: Emergency Medicine

## 2021-02-01 ENCOUNTER — Other Ambulatory Visit: Payer: Self-pay

## 2021-02-01 ENCOUNTER — Ambulatory Visit
Admission: EM | Admit: 2021-02-01 | Discharge: 2021-02-01 | Disposition: A | Discharge status: Home / Self Care | Payer: Commercial Managed Care - PPO | Attending: Sports Medicine | Admitting: Sports Medicine

## 2021-02-01 DIAGNOSIS — R059 Cough, unspecified: Secondary | ICD-10-CM | POA: Diagnosis not present

## 2021-02-01 DIAGNOSIS — Z20822 Contact with and (suspected) exposure to covid-19: Secondary | ICD-10-CM | POA: Diagnosis not present

## 2021-02-01 DIAGNOSIS — Z79899 Other long term (current) drug therapy: Secondary | ICD-10-CM | POA: Insufficient documentation

## 2021-02-01 DIAGNOSIS — J069 Acute upper respiratory infection, unspecified: Secondary | ICD-10-CM

## 2021-02-01 DIAGNOSIS — Z880 Allergy status to penicillin: Secondary | ICD-10-CM | POA: Diagnosis not present

## 2021-02-01 DIAGNOSIS — J029 Acute pharyngitis, unspecified: Secondary | ICD-10-CM | POA: Diagnosis present

## 2021-02-01 DIAGNOSIS — Z881 Allergy status to other antibiotic agents status: Secondary | ICD-10-CM | POA: Insufficient documentation

## 2021-02-01 DIAGNOSIS — R051 Acute cough: Secondary | ICD-10-CM | POA: Insufficient documentation

## 2021-02-01 DIAGNOSIS — H1031 Unspecified acute conjunctivitis, right eye: Secondary | ICD-10-CM

## 2021-02-01 LAB — GROUP A STREP BY PCR: Group A Strep by PCR: NOT DETECTED

## 2021-02-01 MED ORDER — PREDNISONE 20 MG PO TABS: 40.0000 mg | ORAL_TABLET | Freq: Every day | ORAL | 0 refills | Status: AC

## 2021-02-01 MED ORDER — PROMETHAZINE-DM 6.25-15 MG/5ML PO SYRP: 5.0000 mL | ORAL_SOLUTION | Freq: Four times a day (QID) | ORAL | 0 refills | Status: DC | PRN

## 2021-02-01 MED ORDER — LIDOCAINE VISCOUS HCL 2 % MT SOLN: 15.0000 mL | OROMUCOSAL | 0 refills | Status: DC | PRN

## 2021-02-01 MED ORDER — POLYMYXIN B-TRIMETHOPRIM 10000-0.1 UNIT/ML-% OP SOLN: 1.0000 [drp] | OPHTHALMIC | 0 refills | Status: AC

## 2021-02-01 NOTE — Discharge Instructions (Signed)
URI/COLD SYMPTOMS: Your exam today is consistent with a viral illness. Antibiotics are not indicated at this time. Use medications as directed, including cough syrup, nasal saline, and decongestants. Your symptoms should improve over the next few days and resolve within 7-10 days. Increase rest and fluids. F/u if symptoms worsen or predominate such as sore throat, ear pain, productive cough, shortness of breath, or if you develop high fevers or worsening fatigue over the next several days.   IN REGARDS TO THE CHRONIC COUGH--TRY OTC PRILOSEC FOR 2 WEEKS AND IF NOT HELPING FOLLOW BACK UP WITH PCP  You have received COVID testing today either for positive exposure, concerning symptoms that could be related to COVID infection, screening purposes, or re-testing after confirmed positive.  Your test obtained today checks for active viral infection in the last 1-2 weeks. If your test is negative now, you can still test positive later. So, if you do develop symptoms you should either get re-tested and/or isolate x 5 days and then strict mask use x 5 days (unvaccinated) or mask use x 10 days (vaccinated). Please follow CDC guidelines.  While Rapid antigen tests come back in 15-20 minutes, send out PCR/molecular test results typically come back within 1-3 days. In the mean time, if you are symptomatic, assume this could be a positive test and treat/monitor yourself as if you do have COVID.   We will call with test results if positive. Please download the MyChart app and set up a profile to access test results.   If symptomatic, go home and rest. Push fluids. Take Tylenol as needed for discomfort. Gargle warm salt water. Throat lozenges. Take Mucinex DM or Robitussin for cough. Humidifier in bedroom to ease coughing. Warm showers. Also review the COVID handout for more information.  COVID-19 INFECTION: The incubation period of COVID-19 is approximately 14 days after exposure, with most symptoms developing in  roughly 4-5 days. Symptoms may range in severity from mild to critically severe. Roughly 80% of those infected will have mild symptoms. People of any age may become infected with COVID-19 and have the ability to transmit the virus. The most common symptoms include: fever, fatigue, cough, body aches, headaches, sore throat, nasal congestion, shortness of breath, nausea, vomiting, diarrhea, changes in smell and/or taste.    COURSE OF ILLNESS Some patients may begin with mild disease which can progress quickly into critical symptoms. If your symptoms are worsening please call ahead to the Emergency Department and proceed there for further treatment. Recovery time appears to be roughly 1-2 weeks for mild symptoms and 3-6 weeks for severe disease.   GO IMMEDIATELY TO ER FOR FEVER YOU ARE UNABLE TO GET DOWN WITH TYLENOL, BREATHING PROBLEMS, CHEST PAIN, FATIGUE, LETHARGY, INABILITY TO EAT OR DRINK, ETC  QUARANTINE AND ISOLATION: To help decrease the spread of COVID-19 please remain isolated if you have COVID infection or are highly suspected to have COVID infection. This means -stay home and isolate to one room in the home if you live with others. Do not share a bed or bathroom with others while ill, sanitize and wipe down all countertops and keep common areas clean and disinfected. Stay home for 5 days. If you have no symptoms or your symptoms are resolving after 5 days, you can leave your house. Continue to wear a mask around others for 5 additional days. If you have been in close contact (within 6 feet) of someone diagnosed with COVID 19, you are advised to quarantine in your home for 14  days as symptoms can develop anywhere from 2-14 days after exposure to the virus. If you develop symptoms, you  must isolate.  Most current guidelines for COVID after exposure -unvaccinated: isolate 5 days and strict mask use x 5 days. Test on day 5 is possible -vaccinated: wear mask x 10 days if symptoms do not develop -You  do not necessarily need to be tested for COVID if you have + exposure and  develop symptoms. Just isolate at home x10 days from symptom onset During this global pandemic, CDC advises to practice social distancing, try to stay at least 71ft away from others at all times. Wear a face covering. Wash and sanitize your hands regularly and avoid going anywhere that is not necessary.  KEEP IN MIND THAT THE COVID TEST IS NOT 100% ACCURATE AND YOU SHOULD STILL DO EVERYTHING TO PREVENT POTENTIAL SPREAD OF VIRUS TO OTHERS (WEAR MASK, WEAR GLOVES, WASH HANDS AND SANITIZE REGULARLY). IF INITIAL TEST IS NEGATIVE, THIS MAY NOT MEAN YOU ARE DEFINITELY NEGATIVE. MOST ACCURATE TESTING IS DONE 5-7 DAYS AFTER EXPOSURE.   It is not advised by CDC to get re-tested after receiving a positive COVID test since you can still test positive for weeks to months after you have already cleared the virus.   *If you have not been vaccinated for COVID, I strongly suggest you consider getting vaccinated as long as there are no contraindications.

## 2021-02-01 NOTE — ED Triage Notes (Signed)
Patient c/o cough, sore throat, and red eye that started Tuesday.  Patient denies fevers.

## 2021-02-01 NOTE — ED Provider Notes (Signed)
[facility]    CSN: [id number] Arrival date & time: 02/01/21  1504      History   Chief Complaint Chief Complaint  Patient presents with  . Sore Throat  . Cough    HPI Peter Brady is a 20 y.o. male presenting for 6-day history of sore throat, cough and congestion.  Patient says he started noticed some redness of his right eye with swelling under his eyelid and crusty yellow material yesterday.  His mother tested positive for COVID-19 this past week.  He says he took a rapid test today as symptoms started and it was negative.  Patient says that his symptoms are getting worse and not better.  He is denying any fevers but has been fatigued.  Denies chest pain, shortness of breath, vomiting or diarrhea.  Not taking any OTC meds for symptoms other than Zyrtec.  No other concerns today.   HPI  Past Medical History:  Diagnosis Date  . Anxiety   . Patient denies medical problems   . Seasonal allergies     Patient Active Problem List   Diagnosis Date Noted  . 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 Anxiety and depression 07/04/2020    Past Surgical History:  Procedure Laterality Date  . TYMPANOPLASTY         Home Medications    Prior to Admission medications   Medication Sig Start Date End Date Taking? Authorizing Provider  lidocaine (XYLOCAINE) 2 % solution Use as directed 15 mLs in the mouth or throat as needed for mouth pain (swish and spit). 02/01/21  Yes Shirlee Latch, PA-C  predniSONE (DELTASONE) 20 MG tablet Take 2 tablets (40 mg total) by mouth daily for 5 days. 02/01/21 02/06/21 Yes Shirlee Latch, PA-C  promethazine-dextromethorphan (PROMETHAZINE-DM) 6.25-15 MG/5ML syrup Take 5 mLs by mouth 4 (four) times daily as needed for cough. 02/01/21  Yes Eusebio Friendly B, PA-C  trimethoprim-polymyxin b (POLYTRIM) ophthalmic solution Place 1 drop into the right eye every 4 (four) hours for 7 days. 02/01/21 02/08/21 Yes Shirlee Latch, PA-C  busPIRone (BUSPAR) 15 MG  tablet Take 1 tablet (15 mg total) by mouth 2 (two) times daily. 07/04/20   Malfi, Jodelle Gross, FNP  cetirizine (ZYRTEC) 10 MG tablet Take 1 tablet (10 mg total) by mouth daily. 02/18/20   Malfi, Jodelle Gross, FNP  escitalopram (LEXAPRO) 10 MG tablet Take 0.5 tablets (5 mg total) by mouth daily for 7 days, THEN 1 tablet (10 mg total) daily. 07/04/20 08/10/20  Malfi, Jodelle Gross, FNP  fluticasone (FLONASE) 50 MCG/ACT nasal spray Place into both nostrils daily.    [provider]  HYDROcodone-acetaminophen (NORCO/VICODIN) 5-325 MG tablet Take 1 tablet by mouth every 6 (six) hours as needed. 01/11/21   Becky Augusta, NP  hydrOXYzine (VISTARIL) 25 MG capsule Take 1 capsule (25 mg total) by mouth at bedtime as needed. 07/04/20   Malfi, Jodelle Gross, FNP  montelukast (SINGULAIR) 10 MG tablet Take 1 tablet (10 mg total) by mouth at bedtime. 07/04/20   Malfi, Jodelle Gross, FNP    Family History Family History  Problem Relation Age of Onset  . Anxiety disorder Mother   . Suicidality Father   . Depression Father     Social History Social History   Tobacco Use  . Smoking status: Never Smoker  . Smokeless tobacco: Never Used  Vaping Use  . Vaping Use: Every day  . Substances: Nicotine, Flavoring  Substance Use Topics  . Alcohol use: Yes  Comment: social  . Drug use: Never     Allergies   Cephalosporins and Penicillins   Review of Systems Review of Systems  Constitutional: Positive for fatigue. Negative for fever.  HENT: Positive for congestion, rhinorrhea and sore throat. Negative for sinus pressure and sinus pain.   Eyes: Positive for discharge and redness. Negative for photophobia, pain and visual disturbance.  Respiratory: Positive for cough. Negative for shortness of breath.   Cardiovascular: Negative for chest pain.  Gastrointestinal: Negative for abdominal pain, diarrhea, nausea and vomiting.  Musculoskeletal: Negative for myalgias.  Neurological: Negative for weakness, light-headedness  and headaches.     Physical Exam Triage Vital Signs ED Triage Vitals  Enc Vitals Group     BP 02/01/21 1525 104/76     Pulse Rate 02/01/21 1525 70     Resp 02/01/21 1525 16     Temp 02/01/21 1525 98.3 F (36.8 C)     Temp Source 02/01/21 1525 Oral     SpO2 02/01/21 1525 100 %     Weight 02/01/21 1522 140 lb (63.5 kg)     Height 02/01/21 1522 5\' 10"  (1.778 m)     Head Circumference --      Peak Flow --      Pain Score 02/01/21 1521 4     Pain Loc --      Pain Edu? --      Excl. in GC? --    No data found.  Updated Vital Signs BP 104/76 (BP Location: Left Arm)   Pulse 70   Temp 98.3 F (36.8 C) (Oral)   Resp 16   Ht 5\' 10"  (1.778 m)   Wt 140 lb (63.5 kg)   SpO2 100%   BMI 20.09 kg/m    Physical Exam Vitals and nursing note reviewed.  Constitutional:      General: He is not in acute distress.    Appearance: Normal appearance. He is well-developed. He is not ill-appearing or diaphoretic.  HENT:     Head: Normocephalic and atraumatic.     Nose: Congestion and rhinorrhea present.     Mouth/Throat:     Mouth: Mucous membranes are moist.     Pharynx: Oropharynx is clear. Uvula midline. Posterior oropharyngeal erythema present.     Tonsils: No tonsillar abscesses. 1+ on the right. 1+ on the left.  Eyes:     General: No scleral icterus.       Right eye: No discharge.        Left eye: No discharge.     Conjunctiva/sclera:     Right eye: Right conjunctiva is injected.  Neck:     Thyroid: No thyromegaly.     Trachea: No tracheal deviation.  Cardiovascular:     Rate and Rhythm: Normal rate and regular rhythm.     Heart sounds: Normal heart sounds.  Pulmonary:     Effort: Pulmonary effort is normal. No respiratory distress.     Breath sounds: Normal breath sounds. No wheezing, rhonchi or rales.  Chest:     Chest wall: No tenderness.  Musculoskeletal:     Cervical back: Neck supple.  Skin:    General: Skin is warm and dry.     Findings: No rash.  Neurological:      General: No focal deficit present.     Mental Status: He is alert. Mental status is at baseline.     Motor: No weakness.     Gait: Gait normal.  Psychiatric:  Mood and Affect: Mood normal.        Behavior: Behavior normal.        Thought Content: Thought content normal.      UC Treatments / Results  Labs (all labs ordered are listed, but only abnormal results are displayed) Labs Reviewed  GROUP A STREP BY PCR  SARS CORONAVIRUS 2 (TAT 6-24 HRS)    EKG   Radiology No results found.  Procedures Procedures (including critical care time)  Medications Ordered in UC Medications - No data to display  Initial Impression / Assessment and Plan / UC Course  I have reviewed the triage vital signs and the nursing notes.  Pertinent labs & imaging results that were available during my care of the patient were reviewed by me and considered in my medical decision making (see chart for details).   20 year old male presenting for 6-day history of cough, congestion and sore throat.  Admits to redness of the right eye with discomfort and yellowish crusting starting yesterday.  Exposed to COVID through his mother.  Vital signs are all stable and he is overall well-appearing.  Molecular strep test negative.  COVID test performed.  Current CDC guidelines, isolation protocol and ED precautions reviewed patient.  Suspect most likely COVID-19.  Suspect viral conjunctivitis related to COVID-19, but since he has had discolored drainage from the eye and it is significantly injected, we will cover him with Polytrim eyedrops for bacterial causes.  I have also sent in Promethazine DM, viscous lidocaine and prednisone for his symptoms.  Work note given for the next couple of days.  Patient reports at the last minute that he has a chronic cough for the past 4 years.  States he has significant allergies but Zyrtec does not really help the cough.  He also does admit to some acid reflux.  Advised him  to take omeprazole for 2-week trial and if not improving to follow-up with PCP regarding the cough.  Follow-up sooner for any worsening of symptoms.  Reviewed ED precautions.  Final Clinical Impressions(s) / UC Diagnoses   Final diagnoses:  Viral upper respiratory tract infection  Sore throat  Cough  Acute bacterial conjunctivitis of right eye     Discharge Instructions     URI/COLD SYMPTOMS: Your exam today is consistent with a viral illness. Antibiotics are not indicated at this time. Use medications as directed, including cough syrup, nasal saline, and decongestants. Your symptoms should improve over the next few days and resolve within 7-10 days. Increase rest and fluids. F/u if symptoms worsen or predominate such as sore throat, ear pain, productive cough, shortness of breath, or if you develop high fevers or worsening fatigue over the next several days.   IN REGARDS TO THE CHRONIC COUGH--TRY OTC PRILOSEC FOR 2 WEEKS AND IF NOT HELPING FOLLOW BACK UP WITH PCP  You have received COVID testing today either for positive exposure, concerning symptoms that could be related to COVID infection, screening purposes, or re-testing after confirmed positive.  Your test obtained today checks for active viral infection in the last 1-2 weeks. If your test is negative now, you can still test positive later. So, if you do develop symptoms you should either get re-tested and/or isolate x 5 days and then strict mask use x 5 days (unvaccinated) or mask use x 10 days (vaccinated). Please follow CDC guidelines.  While Rapid antigen tests come back in 15-20 minutes, send out PCR/molecular test results typically come back within 1-3 days. In the mean  time, if you are symptomatic, assume this could be a positive test and treat/monitor yourself as if you do have COVID.   We will call with test results if positive. Please download the MyChart app and set up a profile to access test results.   If symptomatic, go  home and rest. Push fluids. Take Tylenol as needed for discomfort. Gargle warm salt water. Throat lozenges. Take Mucinex DM or Robitussin for cough. Humidifier in bedroom to ease coughing. Warm showers. Also review the COVID handout for more information.  COVID-19 INFECTION: The incubation period of COVID-19 is approximately 14 days after exposure, with most symptoms developing in roughly 4-5 days. Symptoms may range in severity from mild to critically severe. Roughly 80% of those infected will have mild symptoms. People of any age may become infected with COVID-19 and have the ability to transmit the virus. The most common symptoms include: fever, fatigue, cough, body aches, headaches, sore throat, nasal congestion, shortness of breath, nausea, vomiting, diarrhea, changes in smell and/or taste.    COURSE OF ILLNESS Some patients may begin with mild disease which can progress quickly into critical symptoms. If your symptoms are worsening please call ahead to the Emergency Department and proceed there for further treatment. Recovery time appears to be roughly 1-2 weeks for mild symptoms and 3-6 weeks for severe disease.   GO IMMEDIATELY TO ER FOR FEVER YOU ARE UNABLE TO GET DOWN WITH TYLENOL, BREATHING PROBLEMS, CHEST PAIN, FATIGUE, LETHARGY, INABILITY TO EAT OR DRINK, ETC  QUARANTINE AND ISOLATION: To help decrease the spread of COVID-19 please remain isolated if you have COVID infection or are highly suspected to have COVID infection. This means -stay home and isolate to one room in the home if you live with others. Do not share a bed or bathroom with others while ill, sanitize and wipe down all countertops and keep common areas clean and disinfected. Stay home for 5 days. If you have no symptoms or your symptoms are resolving after 5 days, you can leave your house. Continue to wear a mask around others for 5 additional days. If you have been in close contact (within 6 feet) of someone diagnosed with  COVID 19, you are advised to quarantine in your home for 14 days as symptoms can develop anywhere from 2-14 days after exposure to the virus. If you develop symptoms, you  must isolate.  Most current guidelines for COVID after exposure -unvaccinated: isolate 5 days and strict mask use x 5 days. Test on day 5 is possible -vaccinated: wear mask x 10 days if symptoms do not develop -You do not necessarily need to be tested for COVID if you have + exposure and  develop symptoms. Just isolate at home x10 days from symptom onset During this global pandemic, CDC advises to practice social distancing, try to stay at least 316ft away from others at all times. Wear a face covering. Wash and sanitize your hands regularly and avoid going anywhere that is not necessary.  KEEP IN MIND THAT THE COVID TEST IS NOT 100% ACCURATE AND YOU SHOULD STILL DO EVERYTHING TO PREVENT POTENTIAL SPREAD OF VIRUS TO OTHERS (WEAR MASK, WEAR GLOVES, WASH HANDS AND SANITIZE REGULARLY). IF INITIAL TEST IS NEGATIVE, THIS MAY NOT MEAN YOU ARE DEFINITELY NEGATIVE. MOST ACCURATE TESTING IS DONE 5-7 DAYS AFTER EXPOSURE.   It is not advised by CDC to get re-tested after receiving a positive COVID test since you can still test positive for weeks to months after you have  already cleared the virus.   *If you have not been vaccinated for COVID, I strongly suggest you consider getting vaccinated as long as there are no contraindications.      ED Prescriptions    Medication Sig Dispense Auth. Provider   predniSONE (DELTASONE) 20 MG tablet Take 2 tablets (40 mg total) by mouth daily for 5 days. 10 tablet Eusebio Friendly B, PA-C   lidocaine (XYLOCAINE) 2 % solution Use as directed 15 mLs in the mouth or throat as needed for mouth pain (swish and spit). 100 mL Eusebio Friendly B, PA-C   promethazine-dextromethorphan (PROMETHAZINE-DM) 6.25-15 MG/5ML syrup Take 5 mLs by mouth 4 (four) times daily as needed for cough. 118 mL Eusebio Friendly B, PA-C    trimethoprim-polymyxin b (POLYTRIM) ophthalmic solution Place 1 drop into the right eye every 4 (four) hours for 7 days. 10 mL Shirlee Latch, PA-C     PDMP not reviewed this encounter.   Shirlee Latch, PA-C 02/01/21 1615

## 2021-02-02 LAB — SARS CORONAVIRUS 2 (TAT 6-24 HRS): SARS Coronavirus 2: NEGATIVE

## 2021-02-09 ENCOUNTER — Ambulatory Visit: Payer: Medicaid Other | Admitting: Internal Medicine

## 2021-02-09 NOTE — Progress Notes (Deleted)
Subjective:    Patient ID: Peter Brady, male    DOB: Dec 09, 2000, 19 y.o.   MRN: [id number]  HPI  He also reports anxiety.  This is currently managed on Escitalopram and Buspirone.  He is not currently seeing a therapist.  He denies depression, SI/HI.  Review of Systems      Past Medical History:  Diagnosis Date  . Anxiety   . Patient denies medical problems   . Seasonal allergies     Current Outpatient Medications  Medication Sig Dispense Refill  . busPIRone (BUSPAR) 15 MG tablet Take 1 tablet (15 mg total) by mouth 2 (two) times daily. 60 tablet 1  . cetirizine (ZYRTEC) 10 MG tablet Take 1 tablet (10 mg total) by mouth daily. 90 tablet 3  . escitalopram (LEXAPRO) 10 MG tablet Take 0.5 tablets (5 mg total) by mouth daily for 7 days, THEN 1 tablet (10 mg total) daily. 90 tablet 1  . fluticasone (FLONASE) 50 MCG/ACT nasal spray Place into both nostrils daily.    Marland Kitchen HYDROcodone-acetaminophen (NORCO/VICODIN) 5-325 MG tablet Take 1 tablet by mouth every 6 (six) hours as needed. 12 tablet 0  . hydrOXYzine (VISTARIL) 25 MG capsule Take 1 capsule (25 mg total) by mouth at bedtime as needed. 90 capsule 1  . lidocaine (XYLOCAINE) 2 % solution Use as directed 15 mLs in the mouth or throat as needed for mouth pain (swish and spit). 100 mL 0  . montelukast (SINGULAIR) 10 MG tablet Take 1 tablet (10 mg total) by mouth at bedtime. 30 tablet 3  . promethazine-dextromethorphan (PROMETHAZINE-DM) 6.25-15 MG/5ML syrup Take 5 mLs by mouth 4 (four) times daily as needed for cough. 118 mL 0   No current facility-administered medications for this visit.    Allergies  Allergen Reactions  . Cephalosporins Rash  . Penicillins Rash    Family History  Problem Relation Age of Onset  . Anxiety disorder Mother   . Suicidality Father   . Depression Father     Social History   Socioeconomic History  . Marital status: Single    Spouse name: Not on file  . Number of children: Not on file  . Years  of education: Not on file  . Highest education level: Not on file  Occupational History  . Not on file  Tobacco Use  . Smoking status: Never Smoker  . Smokeless tobacco: Never Used  Vaping Use  . Vaping Use: Every day  . Substances: Nicotine, Flavoring  Substance and Sexual Activity  . Alcohol use: Yes    Comment: social  . Drug use: Never  . Sexual activity: Not on file  Other Topics Concern  . Not on file  Social History Narrative  . Not on file   Social Determinants of Health   Financial Resource Strain: Not on file  Food Insecurity: Not on file  Transportation Needs: Not on file  Physical Activity: Not on file  Stress: Not on file  Social Connections: Not on file  Intimate Partner Violence: Not on file     Constitutional: Denies fever, malaise, fatigue, headache or abrupt weight changes.  HEENT: Denies eye pain, eye redness, ear pain, ringing in the ears, wax buildup, runny nose, nasal congestion, bloody nose, or sore throat. Respiratory: Denies difficulty breathing, shortness of breath, cough or sputum production.   Cardiovascular: Denies chest pain, chest tightness, palpitations or swelling in the hands or feet.  Gastrointestinal: Denies abdominal pain, bloating, constipation, diarrhea or blood in the stool.  GU: Denies urgency, frequency, pain with urination, burning sensation, blood in urine, odor or discharge. Musculoskeletal: Denies decrease in range of motion, difficulty with gait, muscle pain or joint pain and swelling.  Skin: Denies redness, rashes, lesions or ulcercations.  Neurological: Denies dizziness, difficulty with memory, difficulty with speech or problems with balance and coordination.  Psych: Patient reports anxiety.  Denies depression, SI/HI.  No other specific complaints in a complete review of systems (except as listed in HPI above).  Objective:   Physical Exam   There were no vitals taken for this visit. Wt Readings from Last 3 Encounters:   02/01/21 140 lb (63.5 kg)  01/11/21 150 lb (68 kg)  07/04/20 161 lb 12.8 oz (73.4 kg)    General: Appears their stated age, well developed, well nourished in NAD. Skin: Warm, dry and intact. No rashes, lesions or ulcerations noted. HEENT: Head: normal shape and size; Eyes: sclera white, no icterus, conjunctiva pink, PERRLA and EOMs intact; Ears: Tm's gray and intact, normal light reflex; Nose: mucosa pink and moist, septum midline; Throat/Mouth: Teeth present, mucosa pink and moist, no exudate, lesions or ulcerations noted.  Neck:  Neck supple, trachea midline. No masses, lumps or thyromegaly present.  Cardiovascular: Normal rate and rhythm. S1,S2 noted.  No murmur, rubs or gallops noted. No JVD or BLE edema. No carotid bruits noted. Pulmonary/Chest: Normal effort and positive vesicular breath sounds. No respiratory distress. No wheezes, rales or ronchi noted.  Abdomen: Soft and nontender. Normal bowel sounds. No distention or masses noted. Liver, spleen and kidneys non palpable. Musculoskeletal: Normal range of motion. No signs of joint swelling. No difficulty with gait.  Neurological: Alert and oriented. Cranial nerves II-XII grossly intact. Coordination normal.  Psychiatric: Mood and affect normal. Behavior is normal. Judgment and thought content normal.       Assessment & Plan:    Nicki Reaper, NP This visit occurred during the SARS-CoV-2 public health emergency.  Safety protocols were in place, including screening questions prior to the visit, additional usage of staff PPE, and extensive cleaning of exam room while observing appropriate contact time as indicated for disinfecting solutions.

## 2021-07-06 ENCOUNTER — Telehealth: Payer: Self-pay

## 2021-07-06 NOTE — Telephone Encounter (Signed)
Pt called and was scheduled for PCP next available. Advised pt of message below and he states understanding. Please advise.

## 2021-07-06 NOTE — Telephone Encounter (Signed)
Copied from CRM [id number]. Topic: Appointment Scheduling - Scheduling Inquiry for Clinic >> Jul 03, 2021  3:08 PM Daphine Deutscher D wrote: Pt called asking to schd an appt for his cough and also to get back on his anxiety medication.  I would have scheduled an appt but he had several no shows, he said due to transportation.  CB#  [phone] >> Jul 06, 2021  9:53 AM Skeet Latch, CMA wrote: Jamal Maes to call patient to make appointment.  Was unable to leave message because mailbox is full.  Please make an available appointment with Nicki Reaper, and make patient aware that if he no shows this appointment he will be discharged from practice for no show appointments.

## 2021-07-08 IMAGING — US US SCROTUM W/ DOPPLER COMPLETE
1 series · 14 of 25 positions shown · non-contrast
Comparison: None.

CLINICAL DATA: Left testicular pain, trauma 2 days ago

EXAM:
SCROTAL ULTRASOUND
DOPPLER ULTRASOUND OF THE TESTICLES
TECHNIQUE: Complete ultrasound examination of the testicles, epididymis, and
other scrotal structures was performed. Color and spectral Doppler
ultrasound were also utilized to evaluate blood flow to the
testicles.

[Series 1: us scrotum w/doppler · 14 of 58 slices shown]
[im 1/58]
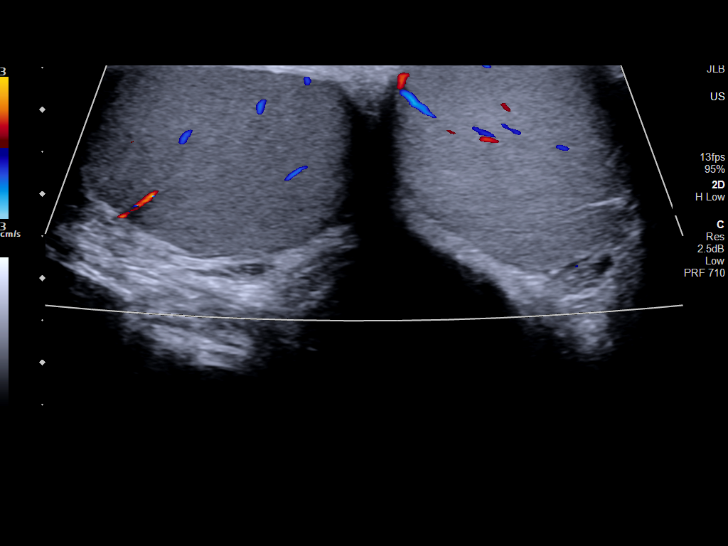
[im 5/58]
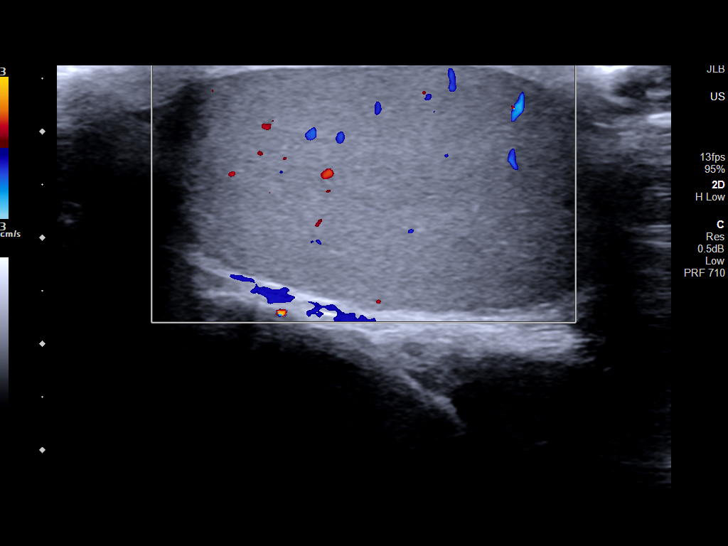
[im 10/58]
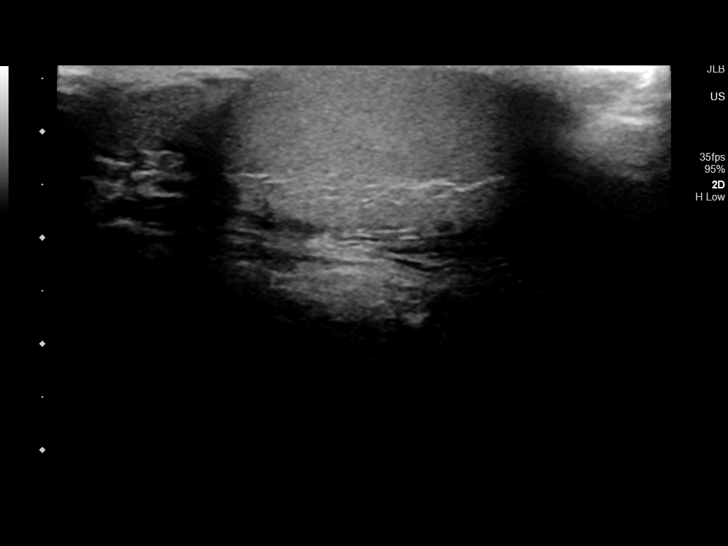
[im 15/58]
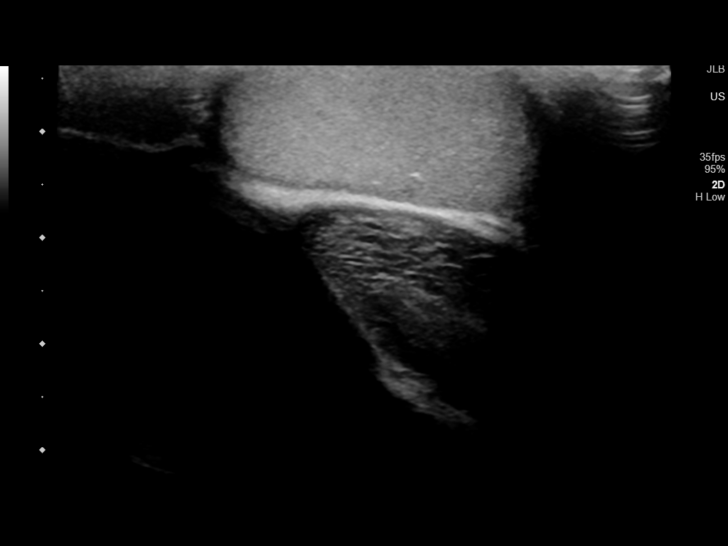
[im 20/58]
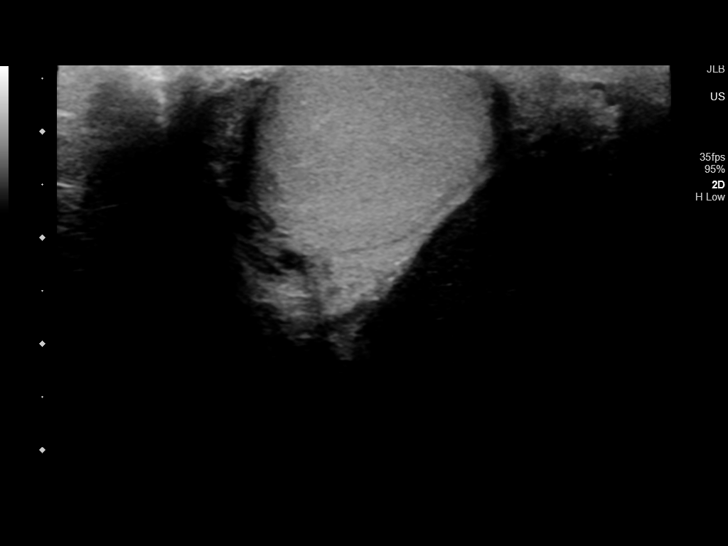
[im 22/58]
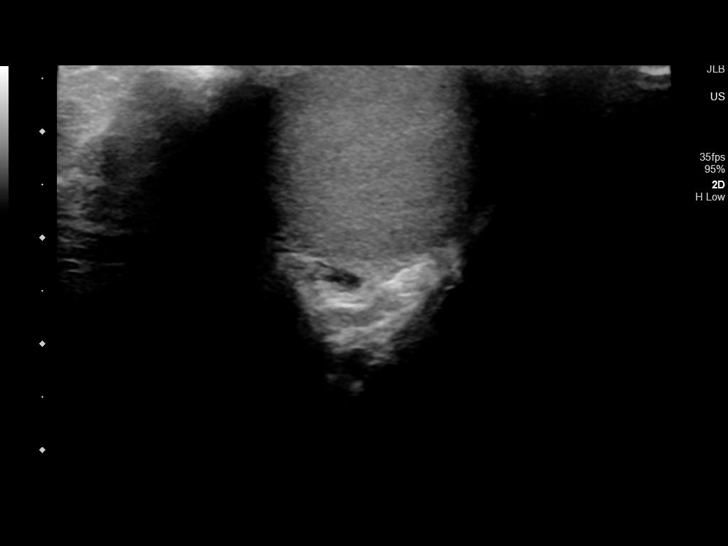
[im 27/58]
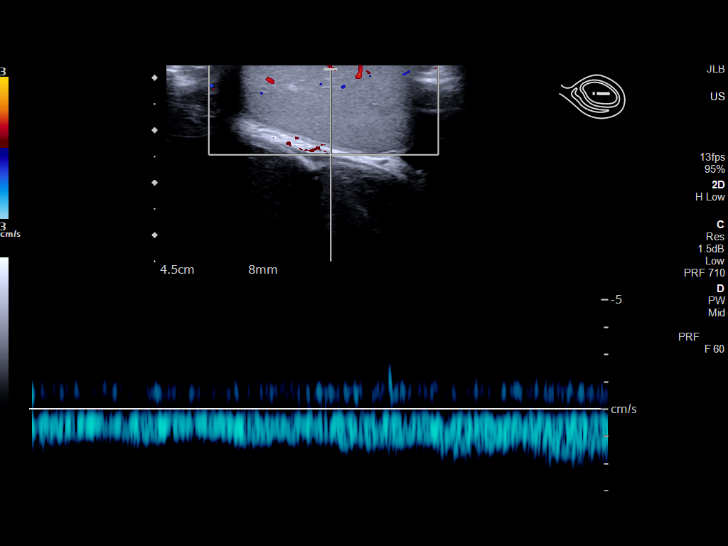
[im 31/58]
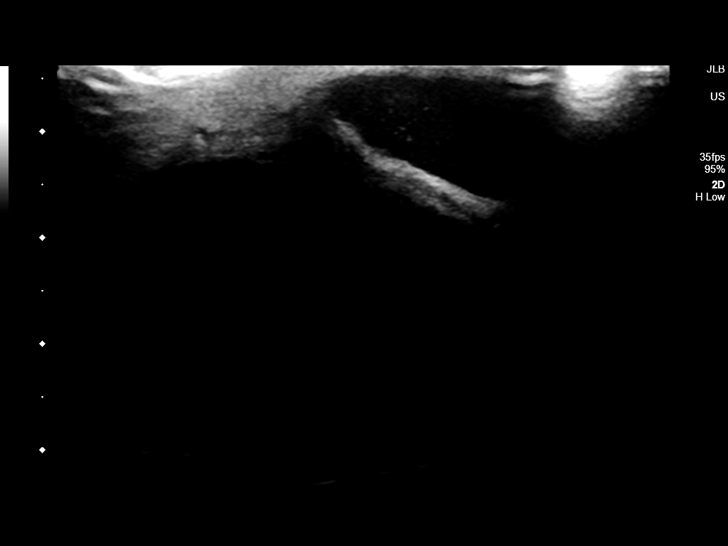
[im 36/58]
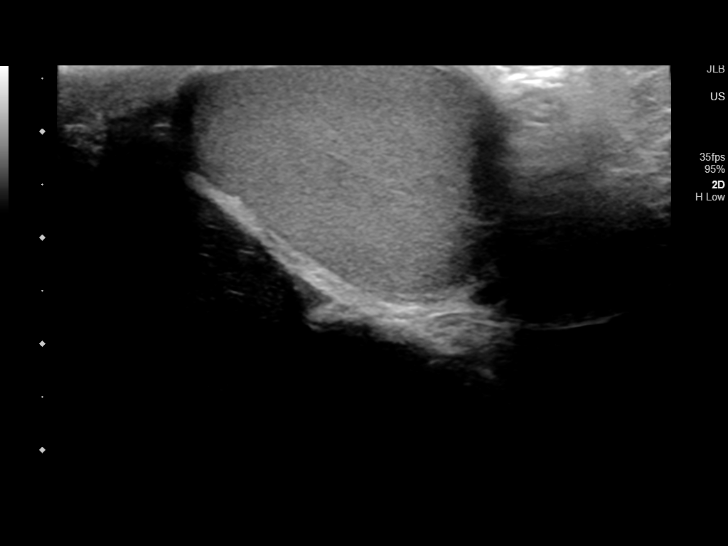
[im 39/58]
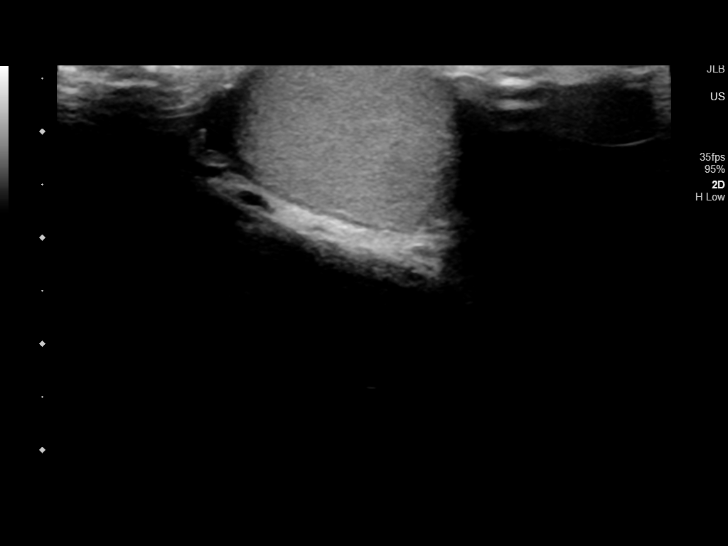
[im 43/58]
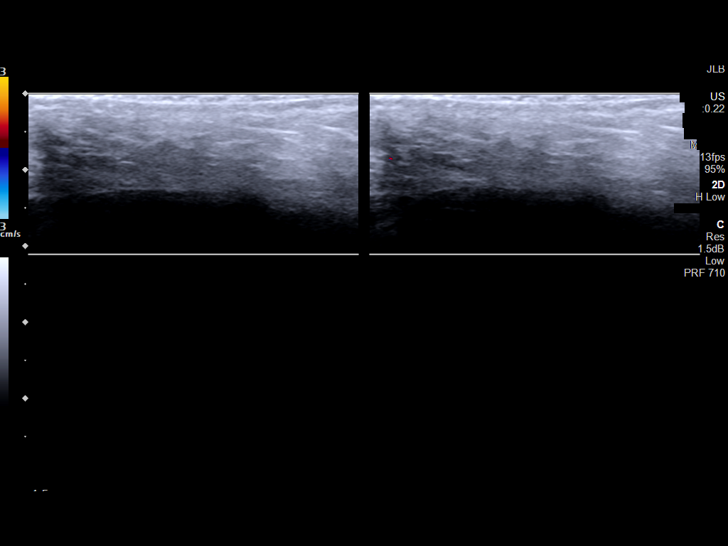
[im 48/58]
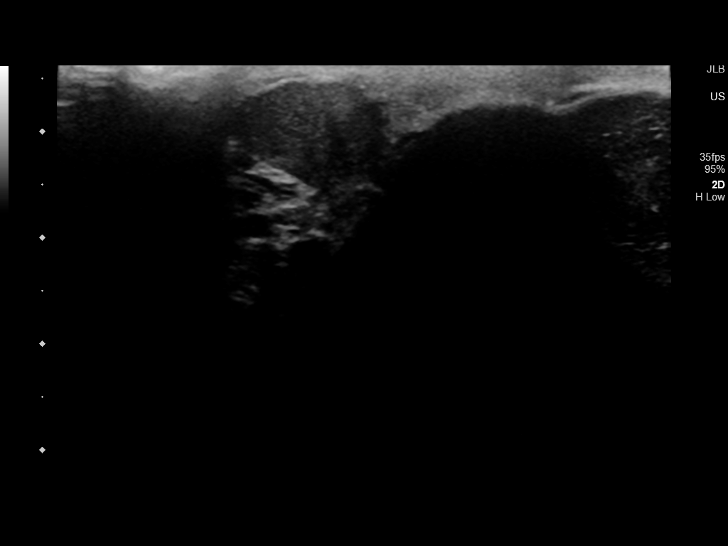
[im 53/58]
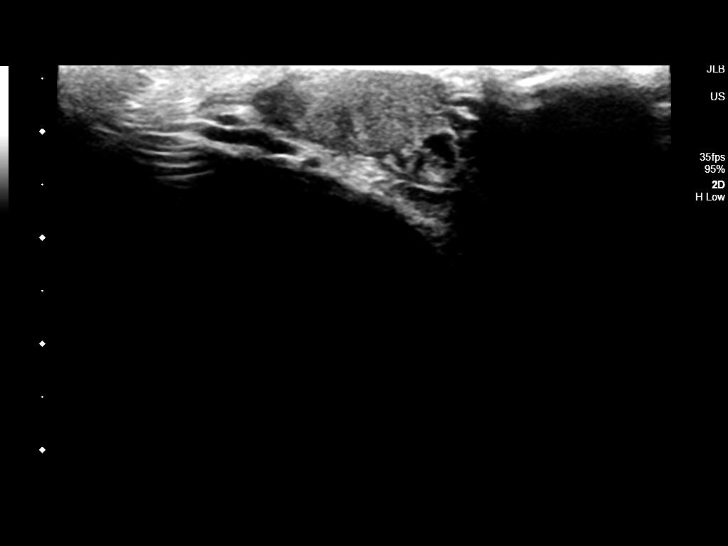
[im 58/58]
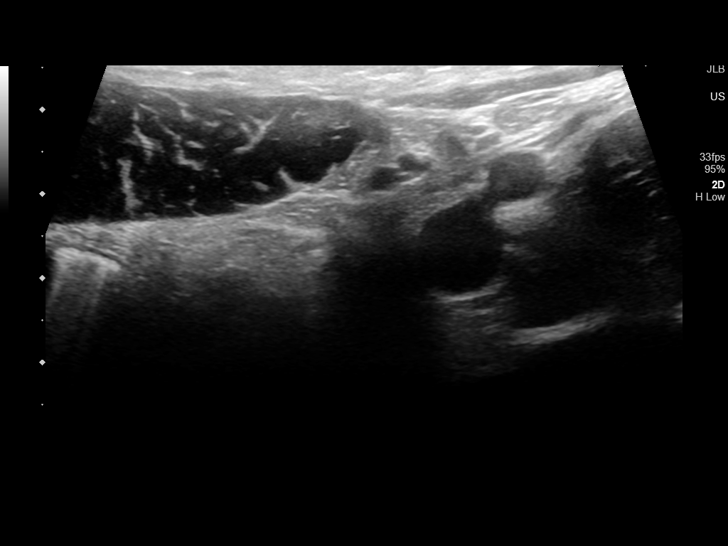

[14 of 25 positions shown; findings below may reference images not displayed]

FINDINGS: Right testicle

Measurements: 4.1 x 2.3 x 2.9 cm. No mass or microlithiasis
visualized.

Left testicle

Measurements: 3.8 x 1.9 x 3.0 cm. No mass or microlithiasis
visualized.

Right epididymis:  Normal in size and appearance.

Left epididymis:  Normal in size and appearance.

Hydrocele:  Trace bilateral hydroceles, left greater than right.

Varicocele:  None visualized.

Pulsed Doppler interrogation of both testes demonstrates normal low
resistance arterial and venous waveforms bilaterally.
IMPRESSION: 1. Trace bilateral hydroceles, left greater than right.
2. Otherwise unremarkable testicular ultrasound.

## 2021-07-13 ENCOUNTER — Encounter: Payer: Self-pay | Admitting: Internal Medicine

## 2021-07-13 ENCOUNTER — Ambulatory Visit (INDEPENDENT_AMBULATORY_CARE_PROVIDER_SITE_OTHER): Payer: Medicaid Other | Admitting: Internal Medicine

## 2021-07-13 ENCOUNTER — Other Ambulatory Visit: Payer: Self-pay

## 2021-07-13 VITALS — BP 110/58 | HR 72 | Temp 97.1°F | Resp 17 | Ht 70.0 in | Wt 142.0 lb

## 2021-07-13 DIAGNOSIS — F419 Anxiety disorder, unspecified: Secondary | ICD-10-CM | POA: Diagnosis not present

## 2021-07-13 DIAGNOSIS — F5104 Psychophysiologic insomnia: Secondary | ICD-10-CM

## 2021-07-13 DIAGNOSIS — R053 Chronic cough: Secondary | ICD-10-CM

## 2021-07-13 DIAGNOSIS — F32A Depression, unspecified: Secondary | ICD-10-CM | POA: Diagnosis not present

## 2021-07-13 MED ORDER — ESCITALOPRAM OXALATE 10 MG PO TABS: ORAL_TABLET | ORAL | 1 refills | Status: DC

## 2021-07-13 MED ORDER — BUSPIRONE HCL 15 MG PO TABS: 15.0000 mg | ORAL_TABLET | Freq: Two times a day (BID) | ORAL | 1 refills | Status: DC

## 2021-07-13 MED ORDER — FLUTICASONE PROPIONATE HFA 44 MCG/ACT IN AERO: 1.0000 | INHALATION_SPRAY | Freq: Two times a day (BID) | RESPIRATORY_TRACT | 12 refills | Status: DC

## 2021-07-13 MED ORDER — HYDROXYZINE PAMOATE 25 MG PO CAPS: 25.0000 mg | ORAL_CAPSULE | Freq: Every evening | ORAL | 1 refills | Status: DC | PRN

## 2021-07-13 NOTE — Assessment & Plan Note (Signed)
Chest x-ray today Rx for Flovent 44 MCG 1 inhalation twice daily

## 2021-07-13 NOTE — Assessment & Plan Note (Signed)
Will restart Hydroxyzine

## 2021-07-13 NOTE — Progress Notes (Signed)
Subjective:    Patient ID: Peter Brady, male    DOB: 2001-08-04, 20 y.o.   MRN: [id number]  HPI  Patient presents to clinic today for follow-up of chronic conditions.  He is establishing care with me today, transferring care from Malva Cogan, NP.  Anxiety and Depression: Chronic, he is not currently taking Escitalopram and Buspirone because he ran out. He would like to get restarted on this today. He is not currently seeing a therapist.  He denies SI/HI.  Insomnia: He has difficulty falling asleep.  He takes Hydroxyzine as needed with some relief of symptoms.  There is no sleep study on file.  He also reports a cough. He reports this started 1 year ago. The cough is nonproductive.  He feels like the always has to clear his throat, post nasal drip. He denies shortness of breath. He does not smoke, but does vape. He does not feel like this is reflux. He has failed Zyrtec, Singulair and Flonase. He denies history of asthma.   Review of Systems     Past Medical History:  Diagnosis Date   Anxiety    Patient denies medical problems    Seasonal allergies     Current Outpatient Medications  Medication Sig Dispense Refill   busPIRone (BUSPAR) 15 MG tablet Take 1 tablet (15 mg total) by mouth 2 (two) times daily. 60 tablet 1   cetirizine (ZYRTEC) 10 MG tablet Take 1 tablet (10 mg total) by mouth daily. 90 tablet 3   escitalopram (LEXAPRO) 10 MG tablet Take 0.5 tablets (5 mg total) by mouth daily for 7 days, THEN 1 tablet (10 mg total) daily. 90 tablet 1   fluticasone (FLONASE) 50 MCG/ACT nasal spray Place into both nostrils daily.     HYDROcodone-acetaminophen (NORCO/VICODIN) 5-325 MG tablet Take 1 tablet by mouth every 6 (six) hours as needed. 12 tablet 0   hydrOXYzine (VISTARIL) 25 MG capsule Take 1 capsule (25 mg total) by mouth at bedtime as needed. 90 capsule 1   lidocaine (XYLOCAINE) 2 % solution Use as directed 15 mLs in the mouth or throat as needed for mouth pain (swish and spit).  100 mL 0   montelukast (SINGULAIR) 10 MG tablet Take 1 tablet (10 mg total) by mouth at bedtime. 30 tablet 3   promethazine-dextromethorphan (PROMETHAZINE-DM) 6.25-15 MG/5ML syrup Take 5 mLs by mouth 4 (four) times daily as needed for cough. 118 mL 0   No current facility-administered medications for this visit.    Allergies  Allergen Reactions   Cephalosporins Rash   Penicillins Rash    Family History  Problem Relation Age of Onset   Anxiety disorder Mother    Suicidality Father    Depression Father     Social History   Socioeconomic History   Marital status: Single    Spouse name: Not on file   Number of children: Not on file   Years of education: Not on file   Highest education level: Not on file  Occupational History   Not on file  Tobacco Use   Smoking status: Never   Smokeless tobacco: Never  Vaping Use   Vaping Use: Every day   Substances: Nicotine, Flavoring  Substance and Sexual Activity   Alcohol use: Yes    Comment: social   Drug use: Never   Sexual activity: Not on file  Other Topics Concern   Not on file  Social History Narrative   Not on file   Social Determinants of Health  Financial Resource Strain: Not on file  Food Insecurity: Not on file  Transportation Needs: Not on file  Physical Activity: Not on file  Stress: Not on file  Social Connections: Not on file  Intimate Partner Violence: Not on file     Constitutional: Denies fever, malaise, fatigue, headache or abrupt weight changes.  HEENT: Denies eye pain, eye redness, ear pain, ringing in the ears, wax buildup, runny nose, nasal congestion, bloody nose, or sore throat. Respiratory: Patient reports chronic cough.  Pt reports chronic cough. Denies difficulty breathing, shortness of breath, or sputum production.   Cardiovascular: Denies chest pain, chest tightness, palpitations or swelling in the hands or feet.  Gastrointestinal: Denies abdominal pain, bloating, constipation, diarrhea or  blood in the stool.  Neurological: Patient reports insomnia.  Denies dizziness, difficulty with memory, difficulty with speech or problems with balance and coordination.  Psych: Patient has a history of anxiety and depression.  Denies SI/HI.  No other specific complaints in a complete review of systems (except as listed in HPI above).  Objective:   Physical Exam BP (!) 110/58 (BP Location: Right Arm, Patient Position: Sitting, Cuff Size: Normal)   Pulse 72   Temp (!) 97.1 F (36.2 C) (Temporal)   Resp 17   Ht 5\' 10"  (1.778 m)   Wt 142 lb (64.4 kg)   SpO2 99%   BMI 20.37 kg/m   Wt Readings from Last 3 Encounters:  02/01/21 140 lb (63.5 kg)  01/11/21 150 lb (68 kg)  07/04/20 161 lb 12.8 oz (73.4 kg)    General: Appears his stated age, well developed, well nourished in NAD. Skin: Warm, dry and intact.  HEENT: Head: normal shape and size; Eyes: EOMs intact; Throat/Mouth: Teeth present, mucosa pink and moist, no exudate, lesions or ulcerations noted.  Neck: No adenopathy noted. Cardiovascular: Normal rate and rhythm. S1,S2 noted.  No murmur, rubs or gallops noted.  Pulmonary/Chest: Normal effort and positive vesicular breath sounds. No respiratory distress. No wheezes, rales or ronchi noted.  Neurological: Alert and oriented. Psychiatric: Mood and affect normal.  Mildly anxious appearing. Judgment and thought content normal.       Assessment & Plan:     07/06/20, NP This visit occurred during the SARS-CoV-2 public health emergency.  Safety protocols were in place, including screening questions prior to the visit, additional usage of staff PPE, and extensive cleaning of exam room while observing appropriate contact time as indicated for disinfecting solutions.

## 2021-07-13 NOTE — Patient Instructions (Signed)

## 2021-07-13 NOTE — Assessment & Plan Note (Signed)
We will restart Escitalopram and Buspirone Support offered

## 2021-10-16 ENCOUNTER — Encounter: Payer: Medicaid Other | Admitting: Internal Medicine

## 2021-10-30 ENCOUNTER — Encounter: Payer: Medicaid Other | Admitting: Internal Medicine

## 2021-10-30 NOTE — Progress Notes (Deleted)
Subjective:    Patient ID: Peter Brady, male    DOB: 04/07/2001, 21 y.o.   MRN: [id number]  HPI  Patient presents to clinic today for his annual exam.  Flu: 12/2016 Tetanus: COVID: Dentist:  Diet: Exercise:  Review of Systems     Past Medical History:  Diagnosis Date   Anxiety    Patient denies medical problems    Seasonal allergies     Current Outpatient Medications  Medication Sig Dispense Refill   busPIRone (BUSPAR) 15 MG tablet Take 1 tablet (15 mg total) by mouth 2 (two) times daily. 180 tablet 1   escitalopram (LEXAPRO) 10 MG tablet Take 0.5 tablets (5 mg total) by mouth daily for 7 days, THEN 1 tablet (10 mg total) daily. 90 tablet 1   fluticasone (FLONASE) 50 MCG/ACT nasal spray Place into both nostrils daily.     fluticasone (FLOVENT HFA) 44 MCG/ACT inhaler Inhale 1 puff into the lungs 2 (two) times daily. 1 each 12   hydrOXYzine (VISTARIL) 25 MG capsule Take 1 capsule (25 mg total) by mouth at bedtime as needed. 90 capsule 1   No current facility-administered medications for this visit.    Allergies  Allergen Reactions   Cephalosporins Rash   Penicillins Rash    Family History  Problem Relation Age of Onset   Anxiety disorder Mother    Suicidality Father    Depression Father     Social History   Socioeconomic History   Marital status: Single    Spouse name: Not on file   Number of children: Not on file   Years of education: Not on file   Highest education level: Not on file  Occupational History   Not on file  Tobacco Use   Smoking status: Never   Smokeless tobacco: Never  Vaping Use   Vaping Use: Every day   Substances: Nicotine, Flavoring  Substance and Sexual Activity   Alcohol use: Yes    Comment: social   Drug use: Never   Sexual activity: Not on file  Other Topics Concern   Not on file  Social History Narrative   Not on file   Social Determinants of Health   Financial Resource Strain: Not on file  Food Insecurity: Not on  file  Transportation Needs: Not on file  Physical Activity: Not on file  Stress: Not on file  Social Connections: Not on file  Intimate Partner Violence: Not on file     Constitutional: Denies fever, malaise, fatigue, headache or abrupt weight changes.  HEENT: Denies eye pain, eye redness, ear pain, ringing in the ears, wax buildup, runny nose, nasal congestion, bloody nose, or sore throat. Respiratory: Patient reports chronic cough.  Denies difficulty breathing, shortness of breath, or sputum production.   Cardiovascular: Denies chest pain, chest tightness, palpitations or swelling in the hands or feet.  Gastrointestinal: Denies abdominal pain, bloating, constipation, diarrhea or blood in the stool.  GU: Denies urgency, frequency, pain with urination, burning sensation, blood in urine, odor or discharge. Musculoskeletal: Denies decrease in range of motion, difficulty with gait, muscle pain or joint pain and swelling.  Skin: Denies redness, rashes, lesions or ulcercations.  Neurological: Patient reports insomnia.  Denies dizziness, difficulty with memory, difficulty with speech or problems with balance and coordination.  Psych: Patient has a history of anxiety and depression.  Denies SI/HI.  No other specific complaints in a complete review of systems (except as listed in HPI above).  Objective:   Physical  There were no vitals taken for this visit. °Wt Readings from Last 3 Encounters:  °07/13/21 142 lb (64.4 kg)  °02/01/21 140 lb (63.5 kg)  °01/11/21 150 lb (68 kg)  ° ° °General: Appears their stated age, well developed, well nourished in NAD. °Skin: Warm, dry and intact. No rashes, lesions or ulcerations noted. °HEENT: Head: normal shape and size; Eyes: sclera white, no icterus, conjunctiva pink, PERRLA and EOMs intact; Ears: Tm's gray and intact, normal light reflex; Nose: mucosa pink and moist, septum midline; Throat/Mouth: Teeth present, mucosa pink and moist, no exudate, lesions  or ulcerations noted.  °Neck:  Neck supple, trachea midline. No masses, lumps or thyromegaly present.  °Cardiovascular: Normal rate and rhythm. S1,S2 noted.  No murmur, rubs or gallops noted. No JVD or BLE edema. No carotid bruits noted. °Pulmonary/Chest: Normal effort and positive vesicular breath sounds. No respiratory distress. No wheezes, rales or ronchi noted.  °Abdomen: Soft and nontender. Normal bowel sounds. No distention or masses noted. Liver, spleen and kidneys non palpable. °Musculoskeletal: Normal range of motion. No signs of joint swelling. No difficulty with gait.  °Neurological: Alert and oriented. Cranial nerves II-XII grossly intact. Coordination normal.  °Psychiatric: Mood and affect normal. Behavior is normal. Judgment and thought content normal.  ° ° °   °Assessment & Plan:  ° °Preventative Health Maintenance: ° °He declines flu shot today °Tetanus °Encouraged him to get a COVID-vaccine °Encouraged him to consume a balanced diet and exercise regimen °Advised him to see a dentist annually °We will check CBC, c-Met, lipid, A1c, HIV and hep C today ° °RTC in 6 months, follow-up chronic conditions °Regina Baity, NP °This visit occurred during the SARS-CoV-2 public health emergency.  Safety protocols were in place, including screening questions prior to the visit, additional usage of staff PPE, and extensive cleaning of exam room while observing appropriate contact time as indicated for disinfecting solutions.  ° °

## 2022-01-11 ENCOUNTER — Other Ambulatory Visit: Payer: Self-pay | Admitting: Internal Medicine

## 2022-01-11 DIAGNOSIS — F419 Anxiety disorder, unspecified: Secondary | ICD-10-CM

## 2022-01-12 NOTE — Telephone Encounter (Signed)
Patient will need an office visit for further refills. ?Requested Prescriptions  ?Pending Prescriptions Disp Refills  ?? escitalopram (LEXAPRO) 10 MG tablet [Pharmacy Med Name: ESCITALOPRAM 10MG  TABLETS] 30 tablet 0  ?  Sig: TAKE 1/2 TABLET(5 MG) BY MOUTH DAILY FOR 7 DAYS THEN TAKE 1 TABLET(10 MG) BY MOUTH DAILY  ?  ? Psychiatry:  Antidepressants - SSRI Failed - 01/11/2022  7:22 AM  ?  ?  Failed - Valid encounter within last 6 months  ?  Recent Outpatient Visits   ?      ? 6 months ago Chronic cough  ? [facility] [place], Coralie Keens, NP  ? 1 year ago Anxiety  ? [place], FNP  ? 1 year ago Pain in left testicle  ? [place], FNP  ? 1 year ago Encounter to establish care  ? [place], FNP  ?  ?  ? ?  ?  ?  Passed - Completed PHQ-2 or PHQ-9 in the last 360 days  ?  ?  ? ?

## 2022-01-12 NOTE — Telephone Encounter (Signed)
Called pt to schedule appt - "Call could not be completed at this time". ?

## 2022-02-23 ENCOUNTER — Other Ambulatory Visit: Payer: Self-pay | Admitting: Internal Medicine

## 2022-02-23 DIAGNOSIS — F419 Anxiety disorder, unspecified: Secondary | ICD-10-CM

## 2022-02-23 NOTE — Telephone Encounter (Signed)
Called patient to schedule appt for medication refills. No answer, recording- call can not be completed at this time hang up and try again later.

## 2022-02-23 NOTE — Telephone Encounter (Signed)
Requested medication (s) are due for refill today: yes   Requested medication (s) are on the active medication list: yes   Last refill:  01/12/22 #30 refills   Future visit scheduled: no   Notes to clinic:  tapered drug. Overdue OV. Called patient to schedule appt for medication refills. No answer recording call can not be completed at this time hang up and try again later. Do you want to give a courtesy refill?     Requested Prescriptions  Pending Prescriptions Disp Refills   escitalopram (LEXAPRO) 10 MG tablet [Pharmacy Med Name: ESCITALOPRAM 10MG  TABLETS] 30 tablet 0    Sig: TAKE 1/2 TABLET(5 MG) BY MOUTH DAILY FOR 7 DAYS THEN TAKE 1 TABLET(10 MG) BY MOUTH DAILY     Psychiatry:  Antidepressants - SSRI Failed - 02/23/2022  8:26 AM      Failed - Valid encounter within last 6 months    Recent Outpatient Visits           7 months ago Chronic cough   [facility] [place], [place], NP   1 year ago Anxiety   [facility], FNP   1 year ago Pain in left testicle   [facility], [facility], FNP   2 years ago Encounter to establish care   [facility], [facility], FNP              Passed - Completed PHQ-2 or PHQ-9 in the last 360 days

## 2022-06-28 ENCOUNTER — Ambulatory Visit: Payer: Self-pay

## 2022-06-28 NOTE — Telephone Encounter (Signed)
  Chief Complaint: Left shoulder pain Symptoms: above Frequency: 2 weeks Pertinent Negatives: Patient denies fever, chest pain, sob Disposition: [] ED /[] Urgent Care (no appt availability in office) / [x] Appointment(In office/virtual)/ []  [place] [facility]/ [] Home Care/ [] Refused Recommended Disposition /[] [place] [facility]/ []  Follow-up with PCP Additional Notes: Pt hurt his shoulder a few months back at the gym. Shoulder got better. PT was helping move furniture a few weeks back and re-injured that same shoulder.     PT thinks its a torn rotator cuff. He says he feels grinding.   Pt aslo wasn't to get back on his anti anxiety medication.    Reason for Disposition  [1] MODERATE pain (e.g., interferes with normal activities) AND [2] present > 3 days  Answer Assessment - Initial Assessment Questions 1. ONSET: "When did the pain start?"     2 weeks - but was hurt months ago 2. LOCATION: "Where is the pain located?"     Left shoulder 3. PAIN: "How bad is the pain?" (Scale 1-10; or mild, moderate, severe)   - MILD (1-3): doesn't interfere with normal activities   - MODERATE (4-7): interferes with normal activities (e.g., work or school) or awakens from sleep   - SEVERE (8-10): excruciating pain, unable to do any normal activities, unable to move arm at all due to pain     4/10 4. WORK OR EXERCISE: "Has there been any recent work or exercise that involved this part of the body?"     Helping someone move 5. CAUSE: "What do you think is causing the shoulder pain?"     Tear in rotator cuff 6. OTHER SYMPTOMS: "Do you have any other symptoms?" (e.g., neck pain, swelling, rash, fever, numbness, weakness)     no 7. PREGNANCY: "Is there any chance you are pregnant?" "When was your last menstrual period?"     na  Protocols used: Shoulder Pain-A-AH

## 2022-06-30 ENCOUNTER — Ambulatory Visit: Payer: Medicaid Other | Admitting: Internal Medicine

## 2022-07-02 ENCOUNTER — Ambulatory Visit: Payer: Medicaid Other | Admitting: Internal Medicine

## 2022-07-02 ENCOUNTER — Ambulatory Visit: Payer: Self-pay | Admitting: *Deleted

## 2022-07-02 NOTE — Telephone Encounter (Signed)
  Chief Complaint: left shoulder pain. Had to cancel appt due to no transportation. Rescheduled for 07/09/22 Symptoms: left shoulder pain at times. 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 Noted shoulder start hurting after lifting furniture x 2 months ago . Sharp pain at times. ROM causes "popping" sound. Pain reported left side of shoulder and top and back. Can sleep  Frequency:  greater than 1 month ago  Pertinent Negatives: Patient denies fever, no N/T. Can use arm.  Disposition: [] ED /[] Urgent Care (no appt availability in office) / [x] Appointment(In office/virtual)/ []  [place] [facility]/ [] Home Care/ [] Refused Recommended Disposition /[] [place] [facility]/ []  Follow-up with PCP Additional Notes:   Patient called to cancel appt due to no transportation. Rescheduled  for 07/09/22. Care advise given.     Reason for Disposition  Shoulder pain is a chronic symptom (recurrent or ongoing AND present > 4 weeks)  Answer Assessment - Initial Assessment Questions 1. ONSET: "When did the pain start?"     Greater than 1 month  2. LOCATION: "Where is the pain located?"     Left side of shoulder and top and back  3. PAIN: "How bad is the pain?" (Scale 1-10; or mild, moderate, severe)   - MILD (1-3): doesn't interfere with normal activities   - MODERATE (4-7): interferes with normal activities (e.g., work or school) or awakens from sleep   - SEVERE (8-10): excruciating pain, unable to do any normal activities, unable to move arm at all due to pain     After work 6 level pain able to sleep  4. WORK OR EXERCISE: "Has there been any recent work or exercise that involved this part of the body?"     Worsening after work, lifting at job 5. CAUSE: "What do you think is causing the shoulder pain?"     Lift boxes at work. One event of assisting someone moving furniture 2 months ago and now feels pain 6. OTHER SYMPTOMS: "Do you have any other symptoms?" (e.g., neck pain, swelling, rash,  fever, numbness, weakness)     ROM left shoulder causes "popping" sounds.  7. PREGNANCY: "Is there any chance you are pregnant?" "When was your last menstrual period?"     na  Protocols used: Shoulder Pain-A-AH

## 2022-07-02 NOTE — Progress Notes (Deleted)
Subjective:    Patient ID: Peter Brady, male    DOB: [birth year], 21 y.o.   MRN: [id number]  HPI  Patient presents to clinic today for follow-up of chronic conditions.  Insomnia: He has difficulty.  He takes as needed with good relief of symptoms.  There is no sleep noting on file.  Anxiety and Depression: Chronic, managed on escitalopram, buspirone and hydroxyzine.  He is not currently seeing a therapist.  He denies SI/HI.  Chronic Cough: Managed with Flovent.  There are no PFTs on file.  He does not follow with pulmonology.  He also reports left shoulder pain.  This started.  He describes the pain as.  Review of Systems  Past Medical History:  Diagnosis Date   Anxiety    Patient denies medical problems    Seasonal allergies     Current Outpatient Medications  Medication Sig Dispense Refill   busPIRone (BUSPAR) 15 MG tablet Take 1 tablet (15 mg total) by mouth 2 (two) times daily. 180 tablet 1   escitalopram (LEXAPRO) 10 MG tablet TAKE 1/2 TABLET(5 MG) BY MOUTH DAILY FOR 7 DAYS THEN TAKE 1 TABLET(10 MG) BY MOUTH DAILY 30 tablet 0   fluticasone (FLONASE) 50 MCG/ACT nasal spray Place into both nostrils daily.     fluticasone (FLOVENT HFA) 44 MCG/ACT inhaler Inhale 1 puff into the lungs 2 (two) times daily. 1 each 12   hydrOXYzine (VISTARIL) 25 MG capsule Take 1 capsule (25 mg total) by mouth at bedtime as needed. 90 capsule 1   No current facility-administered medications for this visit.    Allergies  Allergen Reactions   Cephalosporins Rash   Penicillins Rash    Family History  Problem Relation Age of Onset   Anxiety disorder Mother    Suicidality Father    Depression Father     Social History   Socioeconomic History   Marital status: Single    Spouse name: Not on file   Number of children: Not on file   Years of education: Not on file   Highest education level: Not on file  Occupational History   Not on file  Tobacco Use   Smoking status: Never    Smokeless tobacco: Never  Vaping Use   Vaping Use: Every day   Substances: Nicotine, Flavoring  Substance and Sexual Activity   Alcohol use: Yes    Comment: social   Drug use: Never   Sexual activity: Not on file  Other Topics Concern   Not on file  Social History Narrative   Not on file   Social Determinants of Health   Financial Resource Strain: Not on file  Food Insecurity: Not on file  Transportation Needs: Not on file  Physical Activity: Not on file  Stress: Not on file  Social Connections: Not on file  Intimate Partner Violence: Not on file     Constitutional: Denies fever, malaise, fatigue, headache or abrupt weight changes.  HEENT: Denies eye pain, eye redness, ear pain, ringing in the ears, wax buildup, runny nose, nasal congestion, bloody nose, or sore throat. Respiratory: Patient reports chronic cough.  Denies difficulty breathing, shortness of breath, or sputum production.   Cardiovascular: Denies chest pain, chest tightness, palpitations or swelling in the hands or feet.  Gastrointestinal: Denies abdominal pain, bloating, constipation, diarrhea or blood in the stool.  GU: Denies urgency, frequency, pain with urination, burning sensation, blood in urine, odor or discharge. Musculoskeletal: Patient reports left shoulder pain.  Denies decrease in range  of motion, difficulty with gait, muscle pain or joint swelling.  Skin: Denies redness, rashes, lesions or ulcercations.  Neurological: Patient reports insomnia.  Denies dizziness, difficulty with memory, difficulty with speech or problems with balance and coordination.  Psych: Patient has a history of anxiety and depression.  Denies SI/HI.  No other specific complaints in a complete review of systems (except as listed in HPI above).     Objective:   Physical Exam   There were no vitals taken for this visit. Wt Readings from Last 3 Encounters:  07/13/21 142 lb (64.4 kg)  02/01/21 140 lb (63.5 kg)  01/11/21 150  lb (68 kg)    General: Appears their stated age, well developed, well nourished in NAD. Skin: Warm, dry and intact. No rashes, lesions or ulcerations noted. HEENT: Head: normal shape and size; Eyes: sclera white, no icterus, conjunctiva pink, PERRLA and EOMs intact; Ears: Tm's gray and intact, normal light reflex; Nose: mucosa pink and moist, septum midline; Throat/Mouth: Teeth present, mucosa pink and moist, no exudate, lesions or ulcerations noted.  Neck:  Neck supple, trachea midline. No masses, lumps or thyromegaly present.  Cardiovascular: Normal rate and rhythm. S1,S2 noted.  No murmur, rubs or gallops noted. No JVD or BLE edema. No carotid bruits noted. Pulmonary/Chest: Normal effort and positive vesicular breath sounds. No respiratory distress. No wheezes, rales or ronchi noted.  Abdomen: Soft and nontender. Normal bowel sounds. No distention or masses noted. Liver, spleen and kidneys non palpable. Musculoskeletal: Normal range of motion. No signs of joint swelling. No difficulty with gait.  Neurological: Alert and oriented. Cranial nerves II-XII grossly intact. Coordination normal.  Psychiatric: Mood and affect normal. Behavior is normal. Judgment and thought content normal.          Assessment & Plan:     RTC in 6 months for your annual exam Nicki Reaper, NP

## 2022-07-09 ENCOUNTER — Encounter: Payer: Self-pay | Admitting: Internal Medicine

## 2022-07-09 ENCOUNTER — Ambulatory Visit (INDEPENDENT_AMBULATORY_CARE_PROVIDER_SITE_OTHER): Payer: Medicaid Other | Admitting: Internal Medicine

## 2022-07-09 VITALS — BP 118/72 | HR 75 | Temp 96.9°F | Wt 164.0 lb

## 2022-07-09 DIAGNOSIS — R053 Chronic cough: Secondary | ICD-10-CM | POA: Diagnosis not present

## 2022-07-09 DIAGNOSIS — G8929 Other chronic pain: Secondary | ICD-10-CM

## 2022-07-09 DIAGNOSIS — F32A Depression, unspecified: Secondary | ICD-10-CM

## 2022-07-09 DIAGNOSIS — F5104 Psychophysiologic insomnia: Secondary | ICD-10-CM

## 2022-07-09 DIAGNOSIS — M25512 Pain in left shoulder: Secondary | ICD-10-CM | POA: Diagnosis not present

## 2022-07-09 DIAGNOSIS — F419 Anxiety disorder, unspecified: Secondary | ICD-10-CM | POA: Diagnosis not present

## 2022-07-09 MED ORDER — PREDNISONE 10 MG PO TABS: ORAL_TABLET | ORAL | 0 refills | Status: DC

## 2022-07-09 MED ORDER — BUSPIRONE HCL 15 MG PO TABS: 15.0000 mg | ORAL_TABLET | Freq: Two times a day (BID) | ORAL | 1 refills | Status: DC

## 2022-07-09 MED ORDER — ESCITALOPRAM OXALATE 10 MG PO TABS: ORAL_TABLET | ORAL | 1 refills | Status: DC

## 2022-07-09 MED ORDER — TRAZODONE HCL 50 MG PO TABS
25.0000 mg | ORAL_TABLET | Freq: Every evening | ORAL | 1 refills | Status: AC | PRN
Start: 2022-07-09 — End: ?

## 2022-07-09 NOTE — Assessment & Plan Note (Signed)
Hydroxyzine not helpful, will D/C We will trial trazodone

## 2022-07-09 NOTE — Progress Notes (Signed)
Subjective:    Patient ID: Peter Brady, male    DOB: [birth year], 21 y.o.   MRN: [id number]  HPI  Patient presents to clinic today for follow-up of chronic conditions.  Anxiety and Depression: Chronic, he is not currently Escitalopram, Buspirone and Hydroxyzine.  He is not currently seeing a therapist.  He denies SI/HI.  Chronic Cough: He is not using an inhaler because it didn't help.  There are no PFTs on file.  He does not follow with pulmonology.  Insomnia: He has difficulty staying asleep.  He is taking Hydroxyzine as needed with good relief of symptoms.  There is no sleep study on file.  He also reports left shoulder pain.  This started 3 months. The pain is intermittent. He describes the pain as dull and achy pain. The pain can radiate into his left upper extremity. He does have weakness of the left upper extremity but denies numbness or tingling. He thinks he may have injured it 3 months ago while helping a friend move. He has not taken any medication OTC for this.   Review of Systems     Past Medical History:  Diagnosis Date   Anxiety    Patient denies medical problems    Seasonal allergies     Current Outpatient Medications  Medication Sig Dispense Refill   busPIRone (BUSPAR) 15 MG tablet Take 1 tablet (15 mg total) by mouth 2 (two) times daily. 180 tablet 1   escitalopram (LEXAPRO) 10 MG tablet TAKE 1/2 TABLET(5 MG) BY MOUTH DAILY FOR 7 DAYS THEN TAKE 1 TABLET(10 MG) BY MOUTH DAILY 30 tablet 0   fluticasone (FLONASE) 50 MCG/ACT nasal spray Place into both nostrils daily.     fluticasone (FLOVENT HFA) 44 MCG/ACT inhaler Inhale 1 puff into the lungs 2 (two) times daily. 1 each 12   hydrOXYzine (VISTARIL) 25 MG capsule Take 1 capsule (25 mg total) by mouth at bedtime as needed. 90 capsule 1   No current facility-administered medications for this visit.    Allergies  Allergen Reactions   Cephalosporins Rash   Penicillins Rash    Family History  Problem Relation Age  of Onset   Anxiety disorder Mother    Suicidality Father    Depression Father     Social History   Socioeconomic History   Marital status: Single    Spouse name: Not on file   Number of children: Not on file   Years of education: Not on file   Highest education level: Not on file  Occupational History   Not on file  Tobacco Use   Smoking status: Never   Smokeless tobacco: Never  Vaping Use   Vaping Use: Every day   Substances: Nicotine, Flavoring  Substance and Sexual Activity   Alcohol use: Yes    Comment: social   Drug use: Never   Sexual activity: Not on file  Other Topics Concern   Not on file  Social History Narrative   Not on file   Social Determinants of Health   Financial Resource Strain: Not on file  Food Insecurity: Not on file  Transportation Needs: Not on file  Physical Activity: Not on file  Stress: Not on file  Social Connections: Not on file  Intimate Partner Violence: Not on file     Constitutional: Denies fever, malaise, fatigue, headache or abrupt weight changes.  HEENT: Denies eye pain, eye redness, ear pain, ringing in the ears, wax buildup, runny nose, nasal congestion, bloody nose, or  sore throat. Respiratory: Patient reports cough.  Denies difficulty breathing, shortness of breath, or sputum production.   Cardiovascular: Denies chest pain, chest tightness, palpitations or swelling in the hands or feet.  Gastrointestinal: Denies abdominal pain, bloating, constipation, diarrhea or blood in the stool.  GU: Denies urgency, frequency, pain with urination, burning sensation, blood in urine, odor or discharge. Musculoskeletal: Patient reports left shoulder pain.  Denies decrease in range of motion, difficulty with gait, muscle pain or joint and swelling.  Skin: Denies redness, rashes, lesions or ulcercations.  Neurological: Patient reports insomnia.  Denies dizziness, difficulty with memory, difficulty with speech or problems with balance and  coordination.  Psych: Patient has a history of anxiety and depression.  Denies SI/HI.  No other specific complaints in a complete review of systems (except as listed in HPI above).  Objective:   Physical Exam   BP 118/72 (BP Location: Left Arm, Patient Position: Sitting, Cuff Size: Normal)   Pulse 75   Temp (!) 96.9 F (36.1 C) (Temporal)   Wt 164 lb (74.4 kg)   SpO2 99%   BMI 23.53 kg/m   Wt Readings from Last 3 Encounters:  07/13/21 142 lb (64.4 kg)  02/01/21 140 lb (63.5 kg)  01/11/21 150 lb (68 kg)    General: Appears his stated age, well developed, well nourished in NAD. Cardiovascular: Normal rate and rhythm. S1,S2 noted.  No murmur, rubs or gallops noted.  Pulmonary/Chest: Normal effort and positive vesicular breath sounds. No respiratory distress. No wheezes, rales or ronchi noted.  Musculoskeletal: Normal internal and external rotation of left shoulder.  Negative drop can test on left.  Pain with palpation over the left AC joint and left subacromial bursa.  Strength 5/5 BUE.  Handgrips equal.  No difficulty with gait.  Neurological: Alert and oriented. Cranial nerves II-XII grossly intact. Coordination normal.  Psychiatric: Mood and affect normal. Behavior is normal. Judgment and thought content normal.       Assessment & Plan:     RTC in 6 months for your annual exam Webb Silversmith, NP

## 2022-07-09 NOTE — Assessment & Plan Note (Signed)
He would like to get restarted on escitalopram and buspirone, refilled today Support offered

## 2022-07-09 NOTE — Patient Instructions (Signed)

## 2022-07-09 NOTE — Assessment & Plan Note (Signed)
Rx for Pred taper x6 days If no improvement, return for x-ray of left shoulder

## 2022-07-09 NOTE — Assessment & Plan Note (Signed)
He does not want to take any inhalers as it did not improve his cough He does smoke, encourage smoking cessation

## 2022-08-08 ENCOUNTER — Emergency Department: Payer: Medicaid Other

## 2022-08-08 ENCOUNTER — Emergency Department
Admission: EM | Admit: 2022-08-08 | Discharge: 2022-08-08 | Disposition: A | Discharge status: Home / Self Care | Payer: Medicaid Other | Attending: Emergency Medicine | Admitting: Emergency Medicine

## 2022-08-08 ENCOUNTER — Other Ambulatory Visit: Payer: Self-pay

## 2022-08-08 DIAGNOSIS — S022XXA Fracture of nasal bones, initial encounter for closed fracture: Secondary | ICD-10-CM

## 2022-08-08 DIAGNOSIS — Y92524 Gas station as the place of occurrence of the external cause: Secondary | ICD-10-CM | POA: Diagnosis not present

## 2022-08-08 DIAGNOSIS — S060X0A Concussion without loss of consciousness, initial encounter: Secondary | ICD-10-CM | POA: Insufficient documentation

## 2022-08-08 DIAGNOSIS — Z23 Encounter for immunization: Secondary | ICD-10-CM | POA: Diagnosis not present

## 2022-08-08 DIAGNOSIS — S0121XA Laceration without foreign body of nose, initial encounter: Secondary | ICD-10-CM

## 2022-08-08 DIAGNOSIS — S0990XA Unspecified injury of head, initial encounter: Secondary | ICD-10-CM | POA: Diagnosis present

## 2022-08-08 MED ORDER — CLINDAMYCIN HCL 300 MG PO CAPS: 300.0000 mg | ORAL_CAPSULE | Freq: Three times a day (TID) | ORAL | 0 refills | Status: AC

## 2022-08-08 MED ORDER — LIDOCAINE HCL (PF) 1 % IJ SOLN
5.0000 mL | Freq: Once | INTRAMUSCULAR | Status: AC
  Administered 2022-08-08: 5 mL
  Filled 2022-08-08: qty 5

## 2022-08-08 MED ORDER — TETANUS-DIPHTH-ACELL PERTUSSIS 5-2.5-18.5 LF-MCG/0.5 IM SUSY
0.5000 mL | PREFILLED_SYRINGE | Freq: Once | INTRAMUSCULAR | Status: AC
  Administered 2022-08-08: 0.5 mL via INTRAMUSCULAR
  Filled 2022-08-08: qty 0.5

## 2022-08-08 NOTE — Discharge Instructions (Addendum)
You have fractures of the nasal bones and a laceration of the skin of the nose.  Follow-up with the ENT in 1 week for suture removal and further evaluation of your fractures.  If you are unable to be seen by ENT in this timeframe, return to the ED within 5 to 7 days for suture removal.  Take the antibiotic as prescribed and finish the full 1 week course.  Take Tylenol or ibuprofen as needed for pain.
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
## 2022-08-11 ENCOUNTER — Encounter: Payer: Self-pay | Admitting: Internal Medicine

## 2022-08-11 ENCOUNTER — Ambulatory Visit (INDEPENDENT_AMBULATORY_CARE_PROVIDER_SITE_OTHER): Payer: Medicaid Other | Admitting: Internal Medicine

## 2022-08-11 VITALS — BP 122/84 | HR 82 | Temp 96.9°F | Wt 168.0 lb

## 2022-08-11 DIAGNOSIS — S060X0D Concussion without loss of consciousness, subsequent encounter: Secondary | ICD-10-CM

## 2022-08-11 DIAGNOSIS — F419 Anxiety disorder, unspecified: Secondary | ICD-10-CM

## 2022-08-11 DIAGNOSIS — F32A Depression, unspecified: Secondary | ICD-10-CM | POA: Diagnosis not present

## 2022-08-11 DIAGNOSIS — S022XXD Fracture of nasal bones, subsequent encounter for fracture with routine healing: Secondary | ICD-10-CM | POA: Diagnosis not present

## 2022-08-11 MED ORDER — BUSPIRONE HCL 15 MG PO TABS: 15.0000 mg | ORAL_TABLET | Freq: Three times a day (TID) | ORAL | 0 refills | Status: AC

## 2022-08-11 NOTE — Assessment & Plan Note (Signed)
Deteriorated Continue escitalopram We will increase buspirone to 15 mg 3 times daily Support offered
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
## 2022-08-13 ENCOUNTER — Inpatient Hospital Stay: Payer: Medicaid Other | Admitting: Internal Medicine

## 2022-08-16 ENCOUNTER — Ambulatory Visit: Payer: Medicaid Other | Admitting: Internal Medicine
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
## 2022-08-26 ENCOUNTER — Ambulatory Visit: Payer: Medicaid Other | Admitting: Internal Medicine

## 2022-08-26 NOTE — Progress Notes (Deleted)
Subjective:    Patient ID: Peter Brady, male    DOB: 07-Mar-2001, 21 y.o.   MRN: [id number]  HPI  Patient presents to clinic today for suture removal.  He had 4 stitches placed in a nasal laceration on 08/08/2022.  Review of Systems     Past Medical History:  Diagnosis Date   Anxiety    Patient denies medical problems    Seasonal allergies     Current Outpatient Medications  Medication Sig Dispense Refill   busPIRone (BUSPAR) 15 MG tablet Take 1 tablet (15 mg total) by mouth 3 (three) times daily. 270 tablet 0   escitalopram (LEXAPRO) 10 MG tablet TAKE 1/2 TABLET(5 MG) BY MOUTH DAILY FOR 7 DAYS THEN TAKE 1 TABLET(10 MG) BY MOUTH DAILY 90 tablet 1   fluticasone (FLONASE) 50 MCG/ACT nasal spray Place into both nostrils daily.     traZODone (DESYREL) 50 MG tablet Take 0.5-1 tablets (25-50 mg total) by mouth at bedtime as needed for sleep. 90 tablet 1   No current facility-administered medications for this visit.    Allergies  Allergen Reactions   Cephalosporins Rash   Penicillins Rash    Family History  Problem Relation Age of Onset   Anxiety disorder Mother    Suicidality Father    Depression Father     Social History   Socioeconomic History   Marital status: Single    Spouse name: Not on file   Number of children: Not on file   Years of education: Not on file   Highest education level: Not on file  Occupational History   Not on file  Tobacco Use   Smoking status: Never   Smokeless tobacco: Never  Vaping Use   Vaping Use: Every day   Substances: Nicotine, Flavoring  Substance and Sexual Activity   Alcohol use: Yes    Comment: social   Drug use: Never   Sexual activity: Not on file  Other Topics Concern   Not on file  Social History Narrative   Not on file   Social Determinants of Health   Financial Resource Strain: Not on file  Food Insecurity: Not on file  Transportation Needs: Not on file  Physical Activity: Not on file  Stress: Not on file   Social Connections: Not on file  Intimate Partner Violence: Not on file     Constitutional: Denies fever, malaise, fatigue, headache or abrupt weight changes.  HEENT: Denies eye pain, eye redness, ear pain, ringing in the ears, wax buildup, runny nose, nasal congestion, bloody nose, or sore throat. Respiratory: Denies difficulty breathing, shortness of breath, cough or sputum production.   Cardiovascular: Denies chest pain, chest tightness, palpitations or swelling in the hands or feet.  Gastrointestinal: Denies abdominal pain, bloating, constipation, diarrhea or blood in the stool.  GU: Denies urgency, frequency, pain with urination, burning sensation, blood in urine, odor or discharge. Musculoskeletal: Denies decrease in range of motion, difficulty with gait, muscle pain or joint pain and swelling.  Skin: Patient reports laceration of nose.  Denies redness, rashes, lesions or ulcercations.  Neurological: Denies dizziness, difficulty with memory, difficulty with speech or problems with balance and coordination.  Psych: Denies anxiety, depression, SI/HI.  No other specific complaints in a complete review of systems (except as listed in HPI above).  Objective:   Physical Exam   There were no vitals taken for this visit. Wt Readings from Last 3 Encounters:  08/11/22 168 lb (76.2 kg)  08/08/22 170 lb (77.1  kg)  07/09/22 164 lb (74.4 kg)    General: Appears their stated age, well developed, well nourished in NAD. Skin: Warm, dry and intact. No rashes, lesions or ulcerations noted. HEENT: Head: normal shape and size; Eyes: sclera white, no icterus, conjunctiva pink, PERRLA and EOMs intact; Ears: Tm's gray and intact, normal light reflex; Nose: mucosa pink and moist, septum midline; Throat/Mouth: Teeth present, mucosa pink and moist, no exudate, lesions or ulcerations noted.  Neck:  Neck supple, trachea midline. No masses, lumps or thyromegaly present.  Cardiovascular: Normal rate and  rhythm. S1,S2 noted.  No murmur, rubs or gallops noted. No JVD or BLE edema. No carotid bruits noted. Pulmonary/Chest: Normal effort and positive vesicular breath sounds. No respiratory distress. No wheezes, rales or ronchi noted.  Abdomen: Soft and nontender. Normal bowel sounds. No distention or masses noted. Liver, spleen and kidneys non palpable. Musculoskeletal: Normal range of motion. No signs of joint swelling. No difficulty with gait.  Neurological: Alert and oriented. Cranial nerves II-XII grossly intact. Coordination normal.  Psychiatric: Mood and affect normal. Behavior is normal. Judgment and thought content normal.        Assessment & Plan:   Encounter for Suture Removal, Nasal Laceration:  Sutures removed by this provider Aftercare instructions provided  RTC in 5 months for your annual exam Webb Silversmith, NP

## 2022-09-02 ENCOUNTER — Encounter: Payer: Self-pay | Admitting: Internal Medicine

## 2022-09-02 ENCOUNTER — Ambulatory Visit: Payer: Medicaid Other | Admitting: Internal Medicine

## 2022-12-19 ENCOUNTER — Other Ambulatory Visit: Payer: Self-pay | Admitting: Internal Medicine

## 2022-12-19 DIAGNOSIS — F419 Anxiety disorder, unspecified: Secondary | ICD-10-CM

## 2022-12-19 DIAGNOSIS — F32A Depression, unspecified: Secondary | ICD-10-CM

## 2022-12-20 NOTE — Telephone Encounter (Signed)
Requested Prescriptions  Pending Prescriptions Disp Refills   escitalopram (LEXAPRO) 10 MG tablet [Pharmacy Med Name: ESCITALOPRAM 10MG  TABLETS] 90 tablet 0    Sig: TAKE 1/2 TABLET(5 MG) BY MOUTH DAILY FOR 7 DAYS THEN TAKE 1 TABLET(10 MG) BY MOUTH DAILY     Psychiatry:  Antidepressants - SSRI Failed - 12/19/2022  7:47 AM      Failed - Completed PHQ-2 or PHQ-9 in the last 360 days      Passed - Valid encounter within last 6 months    Recent Outpatient Visits           4 months ago Closed fracture of nasal bone with routine healing, subsequent encounter   [place] [facility] [place], Salvadore Oxford, NP   5 months ago Chronic left shoulder pain   [place] [facility] [place], Salvadore Oxford, NP   1 year ago Chronic cough   [place] [facility] [place], Salvadore Oxford, NP   2 years ago Anxiety   [facility] [place], Jodelle Gross, FNP   2 years ago Pain in left testicle   [place] [facility], Jodelle Gross, [place]

## 2023-05-20 ENCOUNTER — Ambulatory Visit: Payer: Medicaid Other | Admitting: Internal Medicine

## 2023-05-20 NOTE — Progress Notes (Deleted)
Subjective:    Patient ID: Peter Brady, male    DOB: August 06, 2001, 22 y.o.   MRN: [id number]  HPI  Patient presents to clinic today for follow-up of chronic conditions.   Chronic left shoulder pain: There is no imaging on file.  Anxiety and depression: Chronic, managed on escitalopram and buspirone.  He is not currently seeing a therapist.  He denies SI/HI.  Insomnia: He has difficulty.  He is taking trazodone as prescribed.  There is no sleep study on file.  Review of Systems     Past Medical History:  Diagnosis Date   Anxiety    Patient denies medical problems    Seasonal allergies     Current Outpatient Medications  Medication Sig Dispense Refill   busPIRone (BUSPAR) 15 MG tablet Take 1 tablet (15 mg total) by mouth 3 (three) times daily. 270 tablet 0   escitalopram (LEXAPRO) 10 MG tablet TAKE 1/2 TABLET(5 MG) BY MOUTH DAILY FOR 7 DAYS THEN TAKE 1 TABLET(10 MG) BY MOUTH DAILY 90 tablet 0   fluticasone (FLONASE) 50 MCG/ACT nasal spray Place into both nostrils daily.     traZODone (DESYREL) 50 MG tablet Take 0.5-1 tablets (25-50 mg total) by mouth at bedtime as needed for sleep. 90 tablet 1   No current facility-administered medications for this visit.    Allergies  Allergen Reactions   Cephalosporins Rash   Penicillins Rash    Family History  Problem Relation Age of Onset   Anxiety disorder Mother    Suicidality Father    Depression Father     Social History   Socioeconomic History   Marital status: Single    Spouse name: Not on file   Number of children: Not on file   Years of education: Not on file   Highest education level: Not on file  Occupational History   Not on file  Tobacco Use   Smoking status: Never   Smokeless tobacco: Never  Vaping Use   Vaping status: Every Day   Substances: Nicotine, Flavoring  Substance and Sexual Activity   Alcohol use: Yes    Comment: social   Drug use: Never   Sexual activity: Not on file  Other Topics Concern    Not on file  Social History Narrative   Not on file   Social Determinants of Health   Financial Resource Strain: Not on file  Food Insecurity: Not on file  Transportation Needs: Not on file  Physical Activity: Not on file  Stress: Not on file  Social Connections: Not on file  Intimate Partner Violence: Not on file     Constitutional: Denies fever, malaise, fatigue, headache or abrupt weight changes.  HEENT: Denies eye pain, eye redness, ear pain, ringing in the ears, wax buildup, runny nose, nasal congestion, bloody nose, or sore throat. Respiratory: Denies difficulty breathing, shortness of breath, cough or sputum production.   Cardiovascular: Denies chest pain, chest tightness, palpitations or swelling in the hands or feet.  Gastrointestinal: Denies abdominal pain, bloating, constipation, diarrhea or blood in the stool.  GU: Denies urgency, frequency, pain with urination, burning sensation, blood in urine, odor or discharge. Musculoskeletal: Patient reports chronic left shoulder pain.  Denies decrease in range of motion, difficulty with gait, muscle pain or joint swelling.  Skin: Denies redness, rashes, lesions or ulcercations.  Neurological: Patient reports insomnia.  Denies dizziness, difficulty with memory, difficulty with speech or problems with balance and coordination.  Psych: Patient has a history of anxiety and  depression.  Denies SI/HI.  No other specific complaints in a complete review of systems (except as listed in HPI above).  Objective:   Physical Exam  There were no vitals taken for this visit. Wt Readings from Last 3 Encounters:  08/11/22 168 lb (76.2 kg)  08/08/22 170 lb (77.1 kg)  07/09/22 164 lb (74.4 kg)    General: Appears their stated age, well developed, well nourished in NAD. Skin: Warm, dry and intact. No rashes, lesions or ulcerations noted. HEENT: Head: normal shape and size; Eyes: sclera white, no icterus, conjunctiva pink, PERRLA and EOMs  intact; Ears: Tm's gray and intact, normal light reflex; Nose: mucosa pink and moist, septum midline; Throat/Mouth: Teeth present, mucosa pink and moist, no exudate, lesions or ulcerations noted.  Neck:  Neck supple, trachea midline. No masses, lumps or thyromegaly present.  Cardiovascular: Normal rate and rhythm. S1,S2 noted.  No murmur, rubs or gallops noted. No JVD or BLE edema. No carotid bruits noted. Pulmonary/Chest: Normal effort and positive vesicular breath sounds. No respiratory distress. No wheezes, rales or ronchi noted.  Abdomen: Soft and nontender. Normal bowel sounds. No distention or masses noted. Liver, spleen and kidneys non palpable. Musculoskeletal: Normal range of motion. No signs of joint swelling. No difficulty with gait.  Neurological: Alert and oriented. Cranial nerves II-XII grossly intact. Coordination normal.  Psychiatric: Mood and affect normal. Behavior is normal. Judgment and thought content normal.       Assessment & Plan:      Schedule an appointment for your annual exam Nicki Reaper, NP

## 2023-06-07 ENCOUNTER — Ambulatory Visit: Payer: Medicaid Other | Admitting: Internal Medicine

## 2023-06-07 NOTE — Progress Notes (Deleted)
Subjective:    Patient ID: COLUM COLT, male    DOB: June 04, 2001, 22 y.o.   MRN: [id number]  HPI    Review of Systems  Past Medical History:  Diagnosis Date   Anxiety    Patient denies medical problems    Seasonal allergies     Current Outpatient Medications  Medication Sig Dispense Refill   busPIRone (BUSPAR) 15 MG tablet Take 1 tablet (15 mg total) by mouth 3 (three) times daily. 270 tablet 0   escitalopram (LEXAPRO) 10 MG tablet TAKE 1/2 TABLET(5 MG) BY MOUTH DAILY FOR 7 DAYS THEN TAKE 1 TABLET(10 MG) BY MOUTH DAILY 90 tablet 0   fluticasone (FLONASE) 50 MCG/ACT nasal spray Place into both nostrils daily.     traZODone (DESYREL) 50 MG tablet Take 0.5-1 tablets (25-50 mg total) by mouth at bedtime as needed for sleep. 90 tablet 1   No current facility-administered medications for this visit.    Allergies  Allergen Reactions   Cephalosporins Rash   Penicillins Rash    Family History  Problem Relation Age of Onset   Anxiety disorder Mother    Suicidality Father    Depression Father     Social History   Socioeconomic History   Marital status: Single    Spouse name: Not on file   Number of children: Not on file   Years of education: Not on file   Highest education level: Not on file  Occupational History   Not on file  Tobacco Use   Smoking status: Never   Smokeless tobacco: Never  Vaping Use   Vaping status: Every Day   Substances: Nicotine, Flavoring  Substance and Sexual Activity   Alcohol use: Yes    Comment: social   Drug use: Never   Sexual activity: Not on file  Other Topics Concern   Not on file  Social History Narrative   Not on file   Social Determinants of Health   Financial Resource Strain: Not on file  Food Insecurity: Not on file  Transportation Needs: Not on file  Physical Activity: Not on file  Stress: Not on file  Social Connections: Not on file  Intimate Partner Violence: Not on file     Constitutional: Denies fever,  malaise, fatigue, headache or abrupt weight changes.  HEENT: Denies eye pain, eye redness, ear pain, ringing in the ears, wax buildup, runny nose, nasal congestion, bloody nose, or sore throat. Respiratory: Denies difficulty breathing, shortness of breath, cough or sputum production.   Cardiovascular: Denies chest pain, chest tightness, palpitations or swelling in the hands or feet.  Gastrointestinal: Denies abdominal pain, bloating, constipation, diarrhea or blood in the stool.  GU: Denies urgency, frequency, pain with urination, burning sensation, blood in urine, odor or discharge. Musculoskeletal: Denies decrease in range of motion, difficulty with gait, muscle pain or joint pain and swelling.  Skin: Denies redness, rashes, lesions or ulcercations.  Neurological: Patient reports insomnia.  Denies dizziness, difficulty with memory, difficulty with speech or problems with balance and coordination.  Psych: Patient has a history of anxiety and depression.  Denies SI/HI.  No other specific complaints in a complete review of systems (except as listed in HPI above).     Objective:   Physical Exam   There were no vitals taken for this visit. Wt Readings from Last 3 Encounters:  08/11/22 168 lb (76.2 kg)  08/08/22 170 lb (77.1 kg)  07/09/22 164 lb (74.4 kg)    General: Appears their stated  age, well developed, well nourished in NAD. Skin: Warm, dry and intact. No rashes, lesions or ulcerations noted. HEENT: Head: normal shape and size; Eyes: sclera white, no icterus, conjunctiva pink, PERRLA and EOMs intact; Ears: Tm's gray and intact, normal light reflex; Nose: mucosa pink and moist, septum midline; Throat/Mouth: Teeth present, mucosa pink and moist, no exudate, lesions or ulcerations noted.  Neck:  Neck supple, trachea midline. No masses, lumps or thyromegaly present.  Cardiovascular: Normal rate and rhythm. S1,S2 noted.  No murmur, rubs or gallops noted. No JVD or BLE edema. No carotid  bruits noted. Pulmonary/Chest: Normal effort and positive vesicular breath sounds. No respiratory distress. No wheezes, rales or ronchi noted.  Abdomen: Soft and nontender. Normal bowel sounds. No distention or masses noted. Liver, spleen and kidneys non palpable. Musculoskeletal: Normal range of motion. No signs of joint swelling. No difficulty with gait.  Neurological: Alert and oriented. Cranial nerves II-XII grossly intact. Coordination normal.  Psychiatric: Mood and affect normal. Behavior is normal. Judgment and thought content normal.        Assessment & Plan:      Schedule an appointment for your annual exam Nicki Reaper, NP
